# Patient Record
Sex: Female | Born: 1985 | Race: Black or African American | Hispanic: No | Marital: Single | Smoking: Current every day smoker
Health system: Southern US, Community
[De-identification: ages and names within clinical notes are randomized; demographics above are authoritative.]

## PROBLEM LIST (undated history)

## (undated) DIAGNOSIS — Y249XXA Unspecified firearm discharge, undetermined intent, initial encounter: Secondary | ICD-10-CM

## (undated) DIAGNOSIS — D649 Anemia, unspecified: Secondary | ICD-10-CM

## (undated) DIAGNOSIS — W3400XA Accidental discharge from unspecified firearms or gun, initial encounter: Secondary | ICD-10-CM

## (undated) DIAGNOSIS — G4730 Sleep apnea, unspecified: Secondary | ICD-10-CM

## (undated) HISTORY — PX: DENTAL SURGERY: SHX609

---

## 2009-03-23 ENCOUNTER — Emergency Department (HOSPITAL_COMMUNITY): Admission: EM | Admit: 2009-03-23 | Discharge: 2009-03-23 | Payer: Self-pay | Admitting: Emergency Medicine

## 2015-04-14 ENCOUNTER — Emergency Department (HOSPITAL_COMMUNITY)
Admission: EM | Admit: 2015-04-14 | Discharge: 2015-04-14 | Disposition: A | Discharge status: Home / Self Care | Payer: Medicaid Other | Attending: Emergency Medicine | Admitting: Emergency Medicine

## 2015-04-14 ENCOUNTER — Encounter (HOSPITAL_COMMUNITY): Payer: Self-pay | Admitting: Emergency Medicine

## 2015-04-14 DIAGNOSIS — Z720 Tobacco use: Secondary | ICD-10-CM | POA: Insufficient documentation

## 2015-04-14 DIAGNOSIS — K029 Dental caries, unspecified: Secondary | ICD-10-CM | POA: Diagnosis not present

## 2015-04-14 DIAGNOSIS — K002 Abnormalities of size and form of teeth: Secondary | ICD-10-CM | POA: Insufficient documentation

## 2015-04-14 DIAGNOSIS — K0381 Cracked tooth: Secondary | ICD-10-CM | POA: Diagnosis not present

## 2015-04-14 DIAGNOSIS — K088 Other specified disorders of teeth and supporting structures: Secondary | ICD-10-CM | POA: Diagnosis present

## 2015-04-14 MED ORDER — PENICILLIN V POTASSIUM 500 MG PO TABS
500.0000 mg | ORAL_TABLET | Freq: Once | ORAL | Status: AC
  Administered 2015-04-14: 500 mg via ORAL
  Filled 2015-04-14: qty 1

## 2015-04-14 MED ORDER — PENICILLIN V POTASSIUM 500 MG PO TABS: 500.0000 mg | ORAL_TABLET | Freq: Four times a day (QID) | ORAL | Status: AC

## 2015-04-14 MED ORDER — TRAMADOL HCL 50 MG PO TABS: 50.0000 mg | ORAL_TABLET | Freq: Four times a day (QID) | ORAL | Status: DC | PRN

## 2015-04-14 NOTE — ED Notes (Signed)
Pt A+Ox4, reports R lower dental pain "for a while now", worsening, "taking BCs and I think it's worse".  Denies fevers/chills.  Mild swelling noted to cheek.  Speaking full/clear sentences, rr even/un-lab.  No difficulty clearing secretions or maintaining airway.  Skin PWD.  Ambulatory with steady gait.  NAD.

## 2015-04-14 NOTE — Discharge Instructions (Signed)
Dental Caries Dental caries is tooth decay. This decay can cause a hole in teeth (cavity) that can get bigger and deeper over time. HOME CARE  Brush and floss your teeth. Do this at least two times a day.  Use a fluoride toothpaste.  Use a mouth rinse if told by your dentist or doctor.  Eat less sugary and starchy foods. Drink less sugary drinks.  Avoid snacking often on sugary and starchy foods. Avoid sipping often on sugary drinks.  Keep regular checkups and cleanings with your dentist.  Use fluoride supplements if told by your dentist or doctor.  Allow fluoride to be applied to teeth if told by your dentist or doctor. Document Released: 05/10/2008 Document Revised: 12/16/2013 Document Reviewed: 08/03/2012 [facility] Patient Information 2015 [place], [place]. This information is not intended to replace advice given to you by your health care provider. Make sure you discuss any questions you have with your health care provider.  Dental Pain A tooth ache may be caused by cavities (tooth decay). Cavities expose the nerve of the tooth to air and hot or cold temperatures. It may come from an infection or abscess (also called a boil or furuncle) around your tooth. It is also often caused by dental caries (tooth decay). This causes the pain you are having. DIAGNOSIS  Your caregiver can diagnose this problem by exam. TREATMENT   If caused by an infection, it may be treated with medications which kill germs (antibiotics) and pain medications as prescribed by your caregiver. Take medications as directed.  Only take over-the-counter or prescription medicines for pain, discomfort, or fever as directed by your caregiver.  Whether the tooth ache today is caused by infection or dental disease, you should see your dentist as soon as possible for further care. SEEK MEDICAL CARE IF: The exam and treatment you received today has been provided on an emergency basis only. This is not a substitute for  complete medical or dental care. If your problem worsens or new problems (symptoms) appear, and you are unable to meet with your dentist, call or return to this location. SEEK IMMEDIATE MEDICAL CARE IF:   You have a fever.  You develop redness and swelling of your face, jaw, or neck.  You are unable to open your mouth.  You have severe pain uncontrolled by pain medicine. MAKE SURE YOU:   Understand these instructions.  Will watch your condition.  Will get help right away if you are not doing well or get worse. Document Released: 08/01/2005 Document Revised: 10/24/2011 Document Reviewed: 03/19/2008 [facility] Patient Information 2015 [place], [place]. This information is not intended to replace advice given to you by your health care provider. Make sure you discuss any questions you have with your health care provider.   Emergency Department Resource Guide 1) Find a Doctor and Pay Out of Pocket Although you won't have to find out who is covered by your insurance plan, it is a good idea to ask around and get recommendations. You will then need to call the office and see if the doctor you have chosen will accept you as a new patient and what types of options they offer for patients who are self-pay. Some doctors offer discounts or will set up payment plans for their patients who do not have insurance, but you will need to ask so you aren't surprised when you get to your appointment.  2) Contact Your Local Health Department Not all health departments have doctors that can see patients for sick  visits, but many do, so it is worth a call to see if yours does. If you don't know where your local health department is, you can check in your phone book. The CDC also has a tool to help you locate your state's health department, and many state websites also have listings of all of their local health departments.  3) Find a [facility] If your illness is not likely to be very severe or complicated,  you may want to try a walk in clinic. These are popping up all over the country in pharmacies, drugstores, and shopping centers. They're usually staffed by nurse practitioners or physician assistants that have been trained to treat common illnesses and complaints. They're usually fairly quick and inexpensive. However, if you have serious medical issues or chronic medical problems, these are probably not your best option.  No Primary Care Doctor: - Call Health Connect at  [phone] - they can help you locate a primary care doctor that  accepts your insurance, provides certain services, etc. - Physician Referral Service- [phone]  Chronic Pain Problems: Organization         Address  Phone   Notes  [facility]  [phone] Patients need to be referred by their primary care doctor.   Medication Assistance: Organization         Address  Phone   Notes  [facility] Medication [facility] [address] [place], [place] [address] [phone] --Must be a resident of [facility] -- Must have NO insurance coverage whatsoever (no Medicaid/ Medicare, etc.) -- The pt. MUST have a primary care doctor that directs their care regularly and follows them in the community   MedAssist  [phone]   [employer]  [phone]    Agencies that provide inexpensive medical care: Organization         Address  Phone   Notes  [place]  [phone]   [facility]    [phone]   [facility] [place], [place] [address] [phone]   [place] [address] [place] [phone]   Planned Parenthood    [phone]   [facility]    [phone]   [place] and [place] [address], [place] Phone:  [phone], Fax:  [phone] Hours of Operation:  9 am - 6 pm, M-F.  Also accepts Medicaid/Medicare and  self-pay.  [facility] for [place] [address], [address], [place] Phone: [phone], Fax: [phone]. Hours of Operation:  8:30 am - 5:30 pm, M-F.  Also accepts Medicaid and self-pay.  [facility] [address] [place] Phone: [phone]   [place], [place], [place] [phone], Ext. [phone] Mondays & Thursdays: 7-9 AM.  First 15 patients are seen on a first come, first serve basis.    [place] Providers:  Organization         Address  Phone   Notes  [facility] [address],  [phone] Also accepts self-pay patients.  [place], [place]  [phone]   [place], [address], [place] [phone]   [facility] [address] [place] [phone]  Lucianne Lei [address], [place]   [phone] Only accepts [place] patients after they have their name applied to their card.   Self-Pay (no insurance) in [facility]:  Organization         Address  Phone   Notes  Sickle Cell Patients, [facility] [place] [phone]   [facility] [place] [phone]   [facility] [place]  [place], [place], [place] [phone]   [facility]/Dr. Osei-Bonsu  [address], [place] or [address], [place] [phone] Phone number for both [place] and [place] locations is the same.  Urgent Medical and [facility] [address] [place] [phone]   [facility] [address] [place] or [address] [phone]   [facility] [address], [place] [phone], phone;  [phone], fax Sees patients 1st and 3rd Saturday of every month.  Must not qualify for public or private insurance (i.e. Medicaid, Medicare, [place] Health Choice, Veterans' Benefits)  Household income should be no more than 200% of the poverty level The clinic cannot treat you if you are pregnant or think you are pregnant  Sexually transmitted diseases are not treated at the clinic.    Dental Care: Organization         Address  Phone  Notes  [facility] Department of [facility] [place] [phone] Accepts children up to age 76 who are enrolled in [place] or [place]; pregnant women with a Medicaid card; and children who have applied for Medicaid or [place] Health Choice, but were declined, whose parents can pay a reduced fee at time of service.  [facility] Department of [facility]  [address], [place] [phone] Accepts children up to age 84 who are enrolled in [place] or [place]; pregnant women with a Medicaid card; and children who have applied for Medicaid or [place] Health Choice, but were declined, whose parents can pay a reduced fee at time of service.  [place] Adult Dental Access PROGRAM  [place] [phone] Patients are seen by appointment only. Walk-ins are not accepted. [place] will see patients 62 years of age and older. Monday - Tuesday (8am-5pm) Most Wednesdays (8:30-5pm) $30 per visit, cash only  [facility] Adult Dental Access PROGRAM  [address], [facility] [phone] Patients are seen by appointment only. Walk-ins are not accepted. [place] will see patients 69 years of age and older. One Wednesday Evening (Monthly: Volunteer Based).  $30 per visit, cash only  [place]  [phone] for adults; Children under age 25, call Graduate Pediatric Dentistry at [phone]. Children aged 6-14, please call  [phone] to request a pediatric application.  Dental services are provided in all areas of dental care including fillings, crowns and bridges, complete and partial dentures, implants, gum treatment, root canals, and extractions. Preventive care is also provided. Treatment is provided to both adults and children. Patients are selected via a lottery and there is often a waiting list.   [facility] [address] [place]  [phone] [email or web address], [place], [place] [phone], Ext.  [phone] Second and Fourth Thursday of each month, opens at 6:30 AM; Clinic ends at 9 AM.  Patients are seen on a first-come first-served basis, and a limited number are seen during each clinic.   [facility]  [address] Hillard Danker [place], [place] [phone]   Eligibility Requirements You must have lived in [place], [place], or [place] counties for at least the last three months.   You cannot be eligible for state or federal sponsored [employer], including [employer], [place], or [employer].   You generally cannot be eligible for healthcare insurance through your employer.    How to apply: Eligibility screenings are held every Tuesday and Wednesday afternoon from 1:00 pm until 4:00 pm. You do not need an appointment for the interview!  [facility] [address] [place], [place]   [place]  [place] Department  [place]  [phone]    Behavioral Health Resources in the Community: Intensive Outpatient Programs Organization         Address  Phone  Notes  [facility]. [address] [place], [place] [phone]   [facility] Outpatient [address], [place], [place]   ADS: Alcohol [facility] Drug S[facility] [address] [place], [place]   [place] [address]  [place], [place] or [phone]   Substance Abuse Resources Organization         Address  Phone  Notes  Alcohol and Drug Services  [phone]   [place]  [phone]   [facility] [place]   Chinita Pester  [phone]   Residential & Outpatient Substance Abuse Program  [phone]   Psychological Services Organization         Address  Phone  Notes  [facility]  [place]  [phone]   [place] [address] [place] or [phone]    Mobile Crisis Teams Organization         Address  Phone  Notes  [facility], Mobile Crisis Care Unit  [phone]   [facility]  [address] [place], [place]   [place] [address], [place] [phone]    Self-Help/Support Groups Organization         Address  Phone             Notes  [place]. of [place] - variety of support groups  [place] Call for more information  Narcotics Anonymous (NA), [facility] [address], [employer] [place]  2 meetings at this location   Special educational needs teacher         Address  Phone  Notes  [facility] [place],    [place]  [phone]   [facility]  [address] [place] [address], [place], [place]   [place], [place] [phone] Admissions: 8am-3pm M-F  Incentives Substance [place] [address]    [place], [place] [phone]   [facility] [address] Jadene Pierini [place], [place]   The [place].,  [place], [place] - Intensive Outpatient [address] Kristeen [address], [place], [place]   [facility] ([place]  [facility] [address]  °[place], [place] [phone] or [phone]   °[facility] ([facility]) [address] [place], [place] [phone] Accepts Medicaid  °[facility] [address]  °[place] 1-800-659-3381 Substance Abuse/Addiction Treatment  ° °[place] Behavioral Health Resources °Organization         Address  Phone  Notes  °[facility]  ([phone]) [phone]   °Julie Brannon, PhD [address] [place], [place]   ([phone]) [phone] or (336) 951-0000   ° Behavioral   [address] °[place], [place] ([phone]) [phone]   °[facility] [address], [place], [place] ([phone] Insurance/Medicaid/sponsorship through [facility]  °[facility] [address]                                    [place], [place] [phone] Therapy/tele-psych/case  °[facility] [address]  ° [place], [place] ([phone]) [phone]    °Dr. Arfeen  ([phone]   °[facility] [place]  [employer] [place] [facility] 1) [address],  °2) [address], [place] °3)  [address], [place] ([phone]) [phone] °(336) [phone] ° °(336) [phone]   °[place] Child Abuse Hotline (336) 342-1394 or (336) 342-3537 (After Hours)    ° ° ° °

## 2015-04-14 NOTE — ED Provider Notes (Signed)
CSN: [id number]     Arrival date & time 04/14/15  1657 History  This chart was scribed for non-physician practitioner, Danelle Berry, PA-C, working with Pricilla Loveless, MD by Freida Busman, ED Scribe. This patient was seen in room WTR7/WTR7 and the patient's care was started at 5:47 PM.  Chief Complaint  Patient presents with  . Dental Pain    R lower dental pain   The history is provided by the patient. No language interpreter was used.    HPI Comments:  Loretta Brewer is a 29 y.o. female who presents to the Emergency Department complaining of 8/10 right lower dental pain for ~ 4 days. Pt notes she has a broken tooth in the area that hasn't given her pain until 1 month ago, she notes that episode resolved. She believes this episode of pain was caused by something she ate 2 days ago, which caused pain and swelling to her gums and cheeks.  She felt mild nausea and subjective fever 2 days ago, but when she woke up today the swelling was going down, and she currently denies N, V, fever, chills, sweats, neck pain, trouble swallowing, difficulty breathing. Pt applied BC powder to the site with some relief.  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    History reviewed. No pertinent past medical history. History reviewed. No pertinent past surgical history. No family history on file. Social History  Substance Use Topics  . Smoking status: Current Every Day Smoker  . Smokeless tobacco: None  . Alcohol Use: Yes     Comment: 3-4 drinks/week   OB History    No data available     Review of Systems  Constitutional: Negative for fever and chills.  HENT: Positive for dental problem. Negative for trouble swallowing.   Respiratory: Negative for shortness of breath.    Allergies  Review of patient's allergies indicates no known allergies.  Home Medications   Prior to Admission medications   Not on File   BP 126/73  mmHg  Pulse 105  Temp(Src) 98.9 F (37.2 C) (Oral)  Resp 16  Ht 5\' 7"  (1.702 m)  Wt 135 lb (61.236 kg)  BMI 21.14 kg/m2  SpO2 100% Physical Exam  Constitutional: She is oriented to person, place, and time. Vital signs are normal. She appears well-developed and well-nourished. She is cooperative.  Non-toxic appearance. She does not have a sickly appearance. No distress.  HENT:  Head: Normocephalic and atraumatic.  Right Ear: External ear normal.  Left Ear: External ear normal.  Nose: Nose normal.  Mouth/Throat: Oropharynx is clear and moist. Dental caries present. No oropharyngeal exudate.  #29 grossly decayed down to the top of the gumline #28 partially avulsed  TTP in front of both teeth along the gingival and buccal mucousa without any palpable or obvious abscess Multiple carries  Poor dentition  Eyes: Conjunctivae and EOM are normal. Pupils are equal, round, and reactive to light. Right eye exhibits no discharge. Left eye exhibits no discharge. No scleral icterus.  Neck: Normal range of motion. Neck supple. No JVD present. No tracheal deviation present.  Cardiovascular: Normal rate and regular rhythm.   Pulmonary/Chest: Effort normal and breath sounds normal. No stridor. No respiratory distress.  Musculoskeletal: Normal range of motion. She exhibits no edema.  Lymphadenopathy:    She has no cervical adenopathy.  Neurological: She is alert and oriented to person, place, and time. She exhibits normal muscle  tone. Coordination normal.  Skin: Skin is warm and dry. No rash noted. She is not diaphoretic. No erythema. No pallor.  Psychiatric: She has a normal mood and affect. Her behavior is normal. Judgment and thought content normal.   ED Course  Procedures   DIAGNOSTIC STUDIES:  Oxygen Saturation is 100% on RA, normal by my interpretation.    COORDINATION OF CARE:  5:51 PM Discussed treatment plan with pt at bedside and pt agreed to plan.  Labs Review Labs Reviewed - No  data to display  Imaging Review No results found. I have personally reviewed and evaluated these images and lab results as part of my medical decision-making.   EKG Interpretation None      MDM   Final diagnoses:  None   Patient with toothache.  No gross abscess.  Exam unconcerning for Ludwig's angina or spread of infection.  Will treat with clinda and pain medicine.  Urged patient to follow-up with dentist.  Given resources, CM consulted, pt is not from this area, states she has [employer] from [place].     I personally performed the services described in this documentation, which was scribed in my presence. The recorded information has been reviewed and is accurate.     Danelle Berry, PA-C 04/15/15 1933  Pricilla Loveless, MD 04/18/15 [phone]

## 2015-12-04 ENCOUNTER — Emergency Department (HOSPITAL_COMMUNITY): Payer: Medicaid Other

## 2015-12-04 ENCOUNTER — Emergency Department (HOSPITAL_COMMUNITY)
Admission: EM | Admit: 2015-12-04 | Discharge: 2015-12-05 | Disposition: A | Discharge status: Home / Self Care | Payer: Medicaid Other | Attending: Emergency Medicine | Admitting: Emergency Medicine

## 2015-12-04 DIAGNOSIS — Y92410 Unspecified street and highway as the place of occurrence of the external cause: Secondary | ICD-10-CM | POA: Diagnosis not present

## 2015-12-04 DIAGNOSIS — F1010 Alcohol abuse, uncomplicated: Secondary | ICD-10-CM | POA: Diagnosis not present

## 2015-12-04 DIAGNOSIS — Z041 Encounter for examination and observation following transport accident: Secondary | ICD-10-CM | POA: Insufficient documentation

## 2015-12-04 DIAGNOSIS — Y998 Other external cause status: Secondary | ICD-10-CM | POA: Diagnosis not present

## 2015-12-04 DIAGNOSIS — Y9389 Activity, other specified: Secondary | ICD-10-CM | POA: Insufficient documentation

## 2015-12-04 DIAGNOSIS — R55 Syncope and collapse: Secondary | ICD-10-CM | POA: Insufficient documentation

## 2015-12-04 LAB — CBC WITH DIFFERENTIAL/PLATELET
Basophils Absolute: 0 10*3/uL (ref 0.0–0.1)
Basophils Relative: 1 %
Eosinophils Absolute: 0.2 10*3/uL (ref 0.0–0.7)
Eosinophils Relative: 2 %
HEMATOCRIT: 40.6 % (ref 36.0–46.0)
Hemoglobin: 13.2 g/dL (ref 12.0–15.0)
LYMPHS ABS: 2.4 10*3/uL (ref 0.7–4.0)
LYMPHS PCT: 29 %
MCH: 27.3 pg (ref 26.0–34.0)
MCHC: 32.5 g/dL (ref 30.0–36.0)
MCV: 83.9 fL (ref 78.0–100.0)
MONOS PCT: 6 %
Monocytes Absolute: 0.5 10*3/uL (ref 0.1–1.0)
NEUTROS ABS: 5.3 10*3/uL (ref 1.7–7.7)
NEUTROS PCT: 62 %
Platelets: 205 10*3/uL (ref 150–400)
RBC: 4.84 MIL/uL (ref 3.87–5.11)
RDW: 14 % (ref 11.5–15.5)
WBC: 8.4 10*3/uL (ref 4.0–10.5)

## 2015-12-04 LAB — I-STAT CHEM 8, ED
BUN: 8 mg/dL (ref 6–20)
CHLORIDE: 110 mmol/L (ref 101–111)
CREATININE: 1 mg/dL (ref 0.44–1.00)
Calcium, Ion: 1.01 mmol/L — ABNORMAL LOW (ref 1.12–1.23)
GLUCOSE: 91 mg/dL (ref 65–99)
HCT: 45 % (ref 36.0–46.0)
Hemoglobin: 15.3 g/dL — ABNORMAL HIGH (ref 12.0–15.0)
POTASSIUM: 3.5 mmol/L (ref 3.5–5.1)
Sodium: 145 mmol/L (ref 135–145)
TCO2: 19 mmol/L (ref 0–100)

## 2015-12-04 LAB — I-STAT CG4 LACTIC ACID, ED: LACTIC ACID, VENOUS: 3.09 mmol/L — AB (ref 0.5–2.0)

## 2015-12-04 LAB — I-STAT TROPONIN, ED: Troponin i, poc: 0 ng/mL (ref 0.00–0.08)

## 2015-12-04 LAB — PROTIME-INR
INR: 0.97 (ref 0.00–1.49)
Prothrombin Time: 13 seconds (ref 11.6–15.2)

## 2015-12-04 MED ORDER — MIDAZOLAM HCL 2 MG/2ML IJ SOLN
INTRAMUSCULAR | Status: DC
Start: 2015-12-04 — End: 2015-12-05
  Filled 2015-12-04: qty 6

## 2015-12-04 MED ORDER — NALOXONE HCL 0.4 MG/ML IJ SOLN
0.4000 mg | Freq: Once | INTRAMUSCULAR | Status: AC
  Administered 2015-12-05: 0.4 mg via INTRAVENOUS
  Filled 2015-12-04: qty 1

## 2015-12-04 MED ORDER — SODIUM CHLORIDE 0.9 % IV BOLUS (SEPSIS)
1000.0000 mL | Freq: Once | INTRAVENOUS | Status: AC
  Administered 2015-12-04: 1000 mL via INTRAVENOUS

## 2015-12-04 MED ORDER — MIDAZOLAM HCL 2 MG/2ML IJ SOLN: 1.0000 mg | Freq: Once | INTRAMUSCULAR | Status: DC
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
## 2015-12-05 LAB — COMPREHENSIVE METABOLIC PANEL
ALT: 16 U/L (ref 14–54)
ANION GAP: 17 — AB (ref 5–15)
AST: 32 U/L (ref 15–41)
Albumin: 3.9 g/dL (ref 3.5–5.0)
Alkaline Phosphatase: 54 U/L (ref 38–126)
BILIRUBIN TOTAL: 0.5 mg/dL (ref 0.3–1.2)
BUN: 6 mg/dL (ref 6–20)
CALCIUM: 9.1 mg/dL (ref 8.9–10.3)
CO2: 17 mmol/L — ABNORMAL LOW (ref 22–32)
Chloride: 109 mmol/L (ref 101–111)
Creatinine, Ser: 0.8 mg/dL (ref 0.44–1.00)
GFR calc Af Amer: >60 mL/min (ref >60)
Glucose, Bld: 90 mg/dL (ref 65–99)
POTASSIUM: 3.6 mmol/L (ref 3.5–5.1)
Sodium: 143 mmol/L (ref 135–145)
TOTAL PROTEIN: 7 g/dL (ref 6.5–8.1)

## 2015-12-05 LAB — ETHANOL: ALCOHOL ETHYL (B): 282 mg/dL — AB (ref <5)

## 2015-12-05 LAB — LIPASE, BLOOD: LIPASE: 32 U/L (ref 11–51)

## 2015-12-05 MED ORDER — SODIUM CHLORIDE 0.9 % IV BOLUS (SEPSIS)
1000.0000 mL | Freq: Once | INTRAVENOUS | Status: AC
  Administered 2015-12-05: 1000 mL via INTRAVENOUS

## 2015-12-05 NOTE — ED Notes (Addendum)
Attempted 2nd peripheral IV placement to left arm x 3 with no success. Pt appears to have needle track marks on right upper extremity making IV access difficult.   Attempted in/out cath, assisted by Britta MccreedyBarbara, RN, with no success due to difficult anatomy. Dr. Mora Bellmanni at bedside and unable to visualize urethra at this time either. C-collar removed by Dr. Mora Bellmanni. Pt remains responsive to painful stimuli only. When discussing giving pt Narcan, however, pt did shake head exhibiting a "no". Pt also pulled gown down to cover herself. Those are the only purposeful movements noted at present. Continues with snoring respirations.
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
## 2015-12-05 NOTE — ED Notes (Signed)
Pt verbalized understanding of discharge instructions and follow up care

## 2015-12-07 MED ORDER — IOPAMIDOL (ISOVUE-300) INJECTION 61%
100.0000 mL | Freq: Once | INTRAVENOUS | Status: AC | PRN
  Administered 2015-12-04: 100 mL via INTRAVENOUS

## 2015-12-10 ENCOUNTER — Ambulatory Visit (HOSPITAL_COMMUNITY)
Admission: EM | Admit: 2015-12-10 | Discharge: 2015-12-10 | Disposition: A | Discharge status: Home / Self Care | Payer: No Typology Code available for payment source | Attending: Emergency Medicine | Admitting: Emergency Medicine

## 2015-12-10 ENCOUNTER — Encounter (HOSPITAL_COMMUNITY): Payer: Self-pay | Admitting: Emergency Medicine

## 2015-12-10 DIAGNOSIS — M6248 Contracture of muscle, other site: Secondary | ICD-10-CM | POA: Diagnosis not present

## 2015-12-10 DIAGNOSIS — M545 Low back pain, unspecified: Secondary | ICD-10-CM

## 2015-12-10 DIAGNOSIS — M62838 Other muscle spasm: Secondary | ICD-10-CM

## 2015-12-10 DIAGNOSIS — S161XXA Strain of muscle, fascia and tendon at neck level, initial encounter: Secondary | ICD-10-CM

## 2015-12-10 DIAGNOSIS — S161XXD Strain of muscle, fascia and tendon at neck level, subsequent encounter: Secondary | ICD-10-CM

## 2015-12-10 MED ORDER — DICLOFENAC SODIUM 75 MG PO TBEC: 75.0000 mg | DELAYED_RELEASE_TABLET | Freq: Two times a day (BID) | ORAL | Status: DC

## 2015-12-10 MED ORDER — TRAMADOL HCL 50 MG PO TABS: ORAL_TABLET | ORAL | Status: DC

## 2015-12-10 MED ORDER — METAXALONE 800 MG PO TABS: 800.0000 mg | ORAL_TABLET | Freq: Three times a day (TID) | ORAL | Status: DC

## 2015-12-10 NOTE — Discharge Instructions (Signed)
[facility]: [address] [place] [id number]  [phone]  [facility]: [address] [place] [id number]   [phone]  [facility]: [address] [place] [id number]  [phone]  [facility] : [address] [place] [id number] [phone]  [facility]: [address] [place] [phone]  [facility]: [address] [place] [id number] [phone]  Dr. Oneal GroutMahima Pandey [address] [facility] [place] [id number]  [phone]  Dr. Jackie PlumGeorge Osei-Bonsu, [facility]. [address] [place] [address]  [phone]    Take the NSAID on a regular basis for the next 10 days. tramadol for severe pain only. many people find gentle stretching and deep tissue massage helpful. Try [facility] on [address]. They have very reasonable prices and take walk ins. Or you can go to  [facility]. Follow-up with your primary care physician in several days, go to the ER for the signs and symptoms we discussed.
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
## 2015-12-11 ENCOUNTER — Telehealth: Payer: Self-pay | Admitting: Emergency Medicine

## 2016-12-06 ENCOUNTER — Emergency Department (HOSPITAL_COMMUNITY)
Admission: EM | Admit: 2016-12-06 | Discharge: 2016-12-06 | Disposition: A | Discharge status: Home / Self Care | Payer: Medicaid Other | Attending: Emergency Medicine | Admitting: Emergency Medicine

## 2016-12-06 ENCOUNTER — Encounter (HOSPITAL_COMMUNITY): Payer: Self-pay

## 2016-12-06 ENCOUNTER — Emergency Department (HOSPITAL_COMMUNITY): Payer: Medicaid Other

## 2016-12-06 DIAGNOSIS — F17210 Nicotine dependence, cigarettes, uncomplicated: Secondary | ICD-10-CM | POA: Insufficient documentation

## 2016-12-06 DIAGNOSIS — R1031 Right lower quadrant pain: Secondary | ICD-10-CM | POA: Diagnosis not present

## 2016-12-06 DIAGNOSIS — Z79899 Other long term (current) drug therapy: Secondary | ICD-10-CM | POA: Insufficient documentation

## 2016-12-06 DIAGNOSIS — R109 Unspecified abdominal pain: Secondary | ICD-10-CM | POA: Diagnosis present

## 2016-12-06 LAB — URINALYSIS, ROUTINE W REFLEX MICROSCOPIC
Bilirubin Urine: NEGATIVE
Glucose, UA: NEGATIVE mg/dL
Hgb urine dipstick: NEGATIVE
Ketones, ur: NEGATIVE mg/dL
Leukocytes, UA: NEGATIVE
Nitrite: NEGATIVE
Protein, ur: NEGATIVE mg/dL
Specific Gravity, Urine: 1.003 — ABNORMAL LOW (ref 1.005–1.030)
pH: 6 (ref 5.0–8.0)

## 2016-12-06 LAB — CBC WITH DIFFERENTIAL/PLATELET
Basophils Absolute: 0 10*3/uL (ref 0.0–0.1)
Basophils Relative: 0 %
Eosinophils Absolute: 0.5 10*3/uL (ref 0.0–0.7)
Eosinophils Relative: 7 %
HCT: 37.6 % (ref 36.0–46.0)
Hemoglobin: 12.6 g/dL (ref 12.0–15.0)
Lymphocytes Relative: 39 %
Lymphs Abs: 2.6 10*3/uL (ref 0.7–4.0)
MCH: 27.9 pg (ref 26.0–34.0)
MCHC: 33.5 g/dL (ref 30.0–36.0)
MCV: 83.2 fL (ref 78.0–100.0)
Monocytes Absolute: 0.5 10*3/uL (ref 0.1–1.0)
Monocytes Relative: 8 %
Neutro Abs: 3.1 10*3/uL (ref 1.7–7.7)
Neutrophils Relative %: 46 %
Platelets: 252 10*3/uL (ref 150–400)
RBC: 4.52 MIL/uL (ref 3.87–5.11)
RDW: 13.9 % (ref 11.5–15.5)
WBC: 6.7 10*3/uL (ref 4.0–10.5)

## 2016-12-06 LAB — BASIC METABOLIC PANEL
Anion gap: 8 (ref 5–15)
BUN: 8 mg/dL (ref 6–20)
CO2: 24 mmol/L (ref 22–32)
Calcium: 9.3 mg/dL (ref 8.9–10.3)
Chloride: 107 mmol/L (ref 101–111)
Creatinine, Ser: 0.71 mg/dL (ref 0.44–1.00)
GFR calc Af Amer: >60 mL/min (ref >60)
GFR calc non Af Amer: >60 mL/min (ref >60)
Glucose, Bld: 89 mg/dL (ref 65–99)
Potassium: 4.4 mmol/L (ref 3.5–5.1)
Sodium: 139 mmol/L (ref 135–145)

## 2016-12-06 LAB — PREGNANCY, URINE: Preg Test, Ur: NEGATIVE

## 2016-12-06 MED ORDER — IOPAMIDOL (ISOVUE-300) INJECTION 61%
INTRAVENOUS | Status: AC
  Filled 2016-12-06: qty 100

## 2016-12-06 MED ORDER — IOPAMIDOL (ISOVUE-300) INJECTION 61%
INTRAVENOUS | Status: AC
  Filled 2016-12-06: qty 30

## 2016-12-06 MED ORDER — IOPAMIDOL (ISOVUE-300) INJECTION 61%
30.0000 mL | Freq: Once | INTRAVENOUS | Status: DC | PRN
  Administered 2016-12-06: 30 mL via ORAL
  Filled 2016-12-06: qty 30

## 2016-12-06 MED ORDER — IOPAMIDOL (ISOVUE-300) INJECTION 61%
100.0000 mL | Freq: Once | INTRAVENOUS | Status: AC | PRN
  Administered 2016-12-06: 100 mL via INTRAVENOUS

## 2016-12-06 NOTE — ED Triage Notes (Signed)
Patient reports that she woke this AM with abdominal pain that is worse when she bends over. Patient states pain >right.lower. Patient denies N/V/D.

## 2016-12-06 NOTE — ED Provider Notes (Signed)
[facility]-EMERGENCY DEPT Provider Note   CSN: [id number] Arrival date & time: 12/06/16  0741     History   Chief Complaint Chief Complaint  Patient presents with  . Abdominal Pain    HPI Loretta Brewer is a 31 y.o. female.  HPI   30yf with abdominal pain. Onset this morning. Noticed when she woke up. Worse with movement, particularly bending at waist. No n/v. No urinary complaints. No unsuual vaginal bleeding or discharge.   History reviewed. No pertinent past medical history.  There are no active problems to display for this patient.   History reviewed. No pertinent surgical history.  OB History    No data available       Home Medications    Prior to Admission medications   Medication Sig Start Date End Date Taking? Authorizing Provider  diclofenac (VOLTAREN) 75 MG EC tablet Take 1 tablet (75 mg total) by mouth 2 (two) times daily. Take with food 12/10/15   Domenick Gong, MD  Menthol, Topical Analgesic, (ICY HOT EX) Apply topically.    Historical Provider, MD  metaxalone (SKELAXIN) 800 MG tablet Take 1 tablet (800 mg total) by mouth 3 (three) times daily. 12/10/15   Domenick Gong, MD  traMADol Janean Sark) 50 MG tablet 1-2 tabs po q 6 hr prn pain Maximum dose= 8 tablets per day 12/10/15   Domenick Gong, MD    Family History No family history on file.  Social History Social History  Substance Use Topics  . Smoking status: Current Every Day Smoker    Packs/day: 0.25    Types: Cigarettes  . Smokeless tobacco: Never Used  . Alcohol use Yes     Comment: 3-4 drinks/week     Allergies   Patient has no known allergies.   Review of Systems Review of Systems   Physical Exam Updated Vital Signs BP (!) 109/54   Pulse 70   Temp 97.9 F (36.6 C) (Oral)   Resp 16   Ht  (1.702 m)   Wt 135 lb (61.2 kg)   LMP 11/26/2016 (Approximate)   SpO2 100%   BMI 21.14 kg/m   Physical Exam  Constitutional: She appears well-developed and well-nourished. No  distress.  HENT:  Head: Normocephalic and atraumatic.  Eyes: Conjunctivae are normal. Right eye exhibits no discharge. Left eye exhibits no discharge.  Neck: Neck supple.  Cardiovascular: Normal rate, regular rhythm and normal heart sounds.  Exam reveals no gallop and no friction rub.   No murmur heard. Pulmonary/Chest: Effort normal and breath sounds normal. No respiratory distress.  Abdominal: Soft. She exhibits no distension. There is tenderness.    Thin individual. TTP RLQ just lateral to rectus musculature. Question Spigelian hernia? No mass. No rebound tenderness.   Musculoskeletal: She exhibits no edema or tenderness.  Neurological: She is alert.  Skin: Skin is warm and dry.  Psychiatric: She has a normal mood and affect. Her behavior is normal. Thought content normal.  Nursing note and vitals reviewed.    ED Treatments / Results  Labs (all labs ordered are listed, but only abnormal results are displayed) Labs Reviewed  URINALYSIS, ROUTINE W REFLEX MICROSCOPIC - Abnormal; Notable for the following:       Result Value   Color, Urine STRAW (*)    Specific Gravity, Urine 1.003 (*)    All other components within normal limits  CBC WITH DIFFERENTIAL/PLATELET  BASIC METABOLIC PANEL  PREGNANCY, URINE    EKG  EKG Interpretation None  Radiology No results found.   Ct Abdomen Pelvis W Contrast  Result Date: 12/06/2016 CLINICAL DATA:  Right lower quadrant pain EXAM: CT ABDOMEN AND PELVIS WITH CONTRAST TECHNIQUE: Multidetector CT imaging of the abdomen and pelvis was performed using the standard protocol following bolus administration of intravenous contrast. CONTRAST:  ISOVUE-300 IOPAMIDOL (ISOVUE-300) INJECTION 61% COMPARISON:  12/04/2015 FINDINGS: Lower chest: Lung bases are clear. No effusions. Heart is normal size. Hepatobiliary: No focal hepatic abnormality. Gallbladder unremarkable. Pancreas: No focal abnormality or ductal dilatation. Spleen: No focal  abnormality.  Normal size. Adrenals/Urinary Tract: No adrenal abnormality. No focal renal abnormality. No stones or hydronephrosis. Urinary bladder is unremarkable. Stomach/Bowel: Moderate stool burden throughout the colon. I believe the appendix passes medially from the cecum and appears normal. No inflammatory process in the right lower quadrant. Stomach and small bowel decompressed, unremarkable. Vascular/Lymphatic: No evidence of aneurysm or adenopathy. Reproductive: Uterus and adnexa unremarkable.  No mass. Other: No free fluid or free air. Musculoskeletal: No acute bony abnormality. IMPRESSION: No evidence of appendicitis. Moderate stool burden in the colon. No acute findings in the abdomen or pelvis. Electronically Signed   By: Charlett Nose M.D.   On: 12/06/2016 10:31    Procedures Procedures (including critical care time)  Medications Ordered in ED Medications - No data to display   Initial Impression / Assessment and Plan / ED Course  I have reviewed the triage vital signs and the nursing notes.  Pertinent labs & imaging results that were available during my care of the patient were reviewed by me and considered in my medical decision making (see chart for details).     30yf with abdominal pain. CT negative. HD stable. Afebrile. Not pregnant. Doubt emergent condition.   Final Clinical Impressions(s) / ED Diagnoses   Final diagnoses:  Right lower quadrant abdominal pain    New Prescriptions New Prescriptions   No medications on file     Raeford Razor, MD 12/14/16 [phone]

## 2017-08-15 NOTE — L&D Delivery Note (Signed)
   Delivery Note Pt progressed quickly through labor. At 3:13 AM a viable female was delivered via  (Presentation:OA  ).  APGAR: 8/9 ; weight pending. After 1 minute, the cord was clamped and cut. 40 units of pitocin diluted in 1000cc LR was infused rapidly IV.  The placenta separated spontaneously and delivered via CCT and maternal pushing effort.  It was inspected and appears to be intact with a 3 After 1 minute, the cord was clamped and cut. 40 units of pitocin diluted in 1000cc LR was infused rapidly IV.  The placenta separated spontaneously and delivered via CCT and maternal pushing effort.  It was inspected and appears to be intact with a 3 VC  Anesthesia:epidural   Episiotomy:   Lacerations:  none Suture Repair:  Est. Blood Loss (mL):  250  Mom to postpartum.  Baby to Couplet care / Skin to Skin.   The above was performed under my direct supervision and guidance By Dr. Leward QuanSamantha Bearsd.   Loretta CalicoFrances Brewer 06/29/2018, 3:16 AM  .

## 2017-11-17 ENCOUNTER — Other Ambulatory Visit: Payer: Self-pay

## 2017-11-17 ENCOUNTER — Encounter (HOSPITAL_COMMUNITY): Payer: Self-pay

## 2017-11-17 DIAGNOSIS — Y939 Activity, unspecified: Secondary | ICD-10-CM | POA: Insufficient documentation

## 2017-11-17 DIAGNOSIS — Y998 Other external cause status: Secondary | ICD-10-CM | POA: Diagnosis not present

## 2017-11-17 DIAGNOSIS — S199XXA Unspecified injury of neck, initial encounter: Secondary | ICD-10-CM | POA: Diagnosis present

## 2017-11-17 DIAGNOSIS — F17210 Nicotine dependence, cigarettes, uncomplicated: Secondary | ICD-10-CM | POA: Insufficient documentation

## 2017-11-17 DIAGNOSIS — S1081XA Abrasion of other specified part of neck, initial encounter: Secondary | ICD-10-CM | POA: Diagnosis not present

## 2017-11-17 DIAGNOSIS — Y929 Unspecified place or not applicable: Secondary | ICD-10-CM | POA: Insufficient documentation
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
## 2017-11-18 ENCOUNTER — Emergency Department (HOSPITAL_COMMUNITY)
Admission: EM | Admit: 2017-11-18 | Discharge: 2017-11-18 | Disposition: A | Discharge status: Home / Self Care | Payer: Medicaid Other | Source: Home / Self Care | Attending: Emergency Medicine | Admitting: Emergency Medicine

## 2017-11-18 ENCOUNTER — Encounter (HOSPITAL_COMMUNITY): Payer: Self-pay

## 2017-11-18 ENCOUNTER — Emergency Department (HOSPITAL_COMMUNITY)
Admission: EM | Admit: 2017-11-18 | Discharge: 2017-11-18 | Disposition: A | Discharge status: Home / Self Care | Payer: Medicaid Other | Attending: Emergency Medicine | Admitting: Emergency Medicine

## 2017-11-18 ENCOUNTER — Emergency Department (HOSPITAL_COMMUNITY): Payer: Medicaid Other

## 2017-11-18 ENCOUNTER — Other Ambulatory Visit: Payer: Self-pay

## 2017-11-18 DIAGNOSIS — S1091XD Abrasion of unspecified part of neck, subsequent encounter: Secondary | ICD-10-CM

## 2017-11-18 DIAGNOSIS — Z789 Other specified health status: Secondary | ICD-10-CM

## 2017-11-18 DIAGNOSIS — S1091XA Abrasion of unspecified part of neck, initial encounter: Secondary | ICD-10-CM

## 2017-11-18 DIAGNOSIS — Z3490 Encounter for supervision of normal pregnancy, unspecified, unspecified trimester: Secondary | ICD-10-CM

## 2017-11-18 DIAGNOSIS — Z7289 Other problems related to lifestyle: Secondary | ICD-10-CM

## 2017-11-18 LAB — BASIC METABOLIC PANEL
ANION GAP: 9 (ref 5–15)
BUN: 12 mg/dL (ref 6–20)
CO2: 24 mmol/L (ref 22–32)
Calcium: 9 mg/dL (ref 8.9–10.3)
Chloride: 104 mmol/L (ref 101–111)
Creatinine, Ser: 0.65 mg/dL (ref 0.44–1.00)
Glucose, Bld: 103 mg/dL — ABNORMAL HIGH (ref 65–99)
POTASSIUM: 3.6 mmol/L (ref 3.5–5.1)
SODIUM: 137 mmol/L (ref 135–145)

## 2017-11-18 LAB — CBC WITH DIFFERENTIAL/PLATELET
BASOS ABS: 0 10*3/uL (ref 0.0–0.1)
Basophils Relative: 0 %
EOS PCT: 4 %
Eosinophils Absolute: 0.4 10*3/uL (ref 0.0–0.7)
HCT: 35.4 % — ABNORMAL LOW (ref 36.0–46.0)
Hemoglobin: 11.8 g/dL — ABNORMAL LOW (ref 12.0–15.0)
LYMPHS PCT: 29 %
Lymphs Abs: 2.7 10*3/uL (ref 0.7–4.0)
MCH: 27.8 pg (ref 26.0–34.0)
MCHC: 33.3 g/dL (ref 30.0–36.0)
MCV: 83.5 fL (ref 78.0–100.0)
Monocytes Absolute: 1.2 10*3/uL — ABNORMAL HIGH (ref 0.1–1.0)
Monocytes Relative: 12 %
NEUTROS ABS: 5.1 10*3/uL (ref 1.7–7.7)
Neutrophils Relative %: 55 %
PLATELETS: 246 10*3/uL (ref 150–400)
RBC: 4.24 MIL/uL (ref 3.87–5.11)
RDW: 14.2 % (ref 11.5–15.5)
WBC: 9.4 10*3/uL (ref 4.0–10.5)

## 2017-11-18 LAB — I-STAT BETA HCG BLOOD, ED (MC, WL, AP ONLY): I-stat hCG, quantitative: >2000 m[IU]/mL — ABNORMAL HIGH (ref <5)

## 2017-11-18 LAB — HCG, QUANTITATIVE, PREGNANCY: HCG, BETA CHAIN, QUANT, S: 147970 m[IU]/mL — AB (ref <5)

## 2017-11-18 MED ORDER — PRENATAL COMPLETE 14-0.4 MG PO TABS: ORAL_TABLET | ORAL | 0 refills | Status: DC
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
## 2017-11-18 NOTE — ED Notes (Signed)
ED PA at bedside with patient. 

## 2017-11-18 NOTE — Discharge Instructions (Addendum)
You may alternate Tylenol 1000 mg every 6 hours as needed for pain and Ibuprofen 800 mg every 8 hours as needed for pain.  Please take Ibuprofen with food. ° °
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
## 2017-11-18 NOTE — Discharge Instructions (Addendum)
Your seen here today for reported seizures and episode of passing out.  This is likely related to alcohol use.  You are found to have positive pregnancy test.  Please take prenatal vitamins as prescribed.  Please avoid alcohol and ibuprofen.  Please follow-up with women's this week.  You will need additional blood work in 48 hours including quantitative hCG.  Please return if you have any further episodes of passing out, abdominal pain or vaginal bleeding. If you develop worsening or new concerning symptoms you can return to the emergency department for re-evaluation.
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
## 2018-05-07 ENCOUNTER — Ambulatory Visit (INDEPENDENT_AMBULATORY_CARE_PROVIDER_SITE_OTHER): Payer: Self-pay | Admitting: Obstetrics

## 2018-05-07 ENCOUNTER — Other Ambulatory Visit (HOSPITAL_COMMUNITY)
Admission: RE | Admit: 2018-05-07 | Discharge: 2018-05-07 | Disposition: A | Discharge status: Home / Self Care | Payer: Medicaid Other | Source: Ambulatory Visit | Attending: Obstetrics | Admitting: Obstetrics

## 2018-05-07 ENCOUNTER — Encounter: Payer: Self-pay | Admitting: Obstetrics

## 2018-05-07 VITALS — BP 118/78 | HR 110 | Wt 145.9 lb

## 2018-05-07 DIAGNOSIS — F17210 Nicotine dependence, cigarettes, uncomplicated: Secondary | ICD-10-CM

## 2018-05-07 DIAGNOSIS — Z3482 Encounter for supervision of other normal pregnancy, second trimester: Secondary | ICD-10-CM

## 2018-05-07 DIAGNOSIS — Z3490 Encounter for supervision of normal pregnancy, unspecified, unspecified trimester: Secondary | ICD-10-CM | POA: Insufficient documentation

## 2018-05-07 DIAGNOSIS — O0930 Supervision of pregnancy with insufficient antenatal care, unspecified trimester: Secondary | ICD-10-CM

## 2018-05-07 DIAGNOSIS — N898 Other specified noninflammatory disorders of vagina: Secondary | ICD-10-CM

## 2018-05-07 DIAGNOSIS — Z3492 Encounter for supervision of normal pregnancy, unspecified, second trimester: Secondary | ICD-10-CM | POA: Diagnosis not present

## 2018-05-07 DIAGNOSIS — O0932 Supervision of pregnancy with insufficient antenatal care, second trimester: Secondary | ICD-10-CM

## 2018-05-07 DIAGNOSIS — Z3A00 Weeks of gestation of pregnancy not specified: Secondary | ICD-10-CM | POA: Diagnosis not present

## 2018-05-07 MED ORDER — PRENATAL PLUS 27-1 MG PO TABS: 1.0000 | ORAL_TABLET | Freq: Every day | ORAL | 3 refills | Status: DC

## 2018-05-07 NOTE — Progress Notes (Signed)
Pt states this was not a planned pregnancy FOB is active.

## 2018-05-07 NOTE — Progress Notes (Signed)
Subjective:    Loretta Brewer is being seen today for her first obstetrical visit.  This is not a planned pregnancy. She is at [redacted]w[redacted]d gestation. Her obstetrical history is significant for smoker. Relationship with FOB: significant other, living together. Patient does intend to breast feed. Pregnancy history fully reviewed.  The information documented in the HPI was reviewed and verified.  Menstrual History: OB History    Gravida  5   Para      Term      Preterm      AB  2   Living  2     SAB      TAB  2   Ectopic      Multiple      Live Births               Patient's last menstrual period was 11/04/2017.    History reviewed. No pertinent past medical history.  Past Surgical History:  Procedure Laterality Date  . DENTAL SURGERY       (Not in a hospital admission) No Known Allergies  Social History   Tobacco Use  . Smoking status: Current Every Day Smoker    Packs/day: 0.25    Types: Cigarettes  . Smokeless tobacco: Never Used  Substance Use Topics  . Alcohol use: Yes    Comment: early in pregnacy not anymore "been a couple months"    Family History  Problem Relation Age of Onset  . Hypertension Father      Review of Systems Constitutional: negative for weight loss Gastrointestinal: negative for vomiting Genitourinary:negative for genital lesions and vaginal discharge and dysuria Musculoskeletal:negative for back pain Behavioral/Psych: negative for abusive relationship, depression, illegal drug usage and tobacco use    Objective:    BP 118/78   Pulse (!) 110   Wt 145 lb 14.4 oz (66.2 kg)   LMP 11/04/2017   BMI 22.85 kg/m  General Appearance:    Alert, cooperative, no distress, appears stated age  Head:    Normocephalic, without obvious abnormality, atraumatic  Eyes:    PERRL, conjunctiva/corneas clear, EOM's intact, fundi    benign, both eyes  Ears:    Normal TM's and external ear canals, both ears  Nose:   Nares normal, septum midline,  mucosa normal, no drainage    or sinus tenderness  Throat:   Lips, mucosa, and tongue normal; teeth and gums normal  Neck:   Supple, symmetrical, trachea midline, no adenopathy;    thyroid:  no enlargement/tenderness/nodules; no carotid   bruit or JVD  Back:     Symmetric, no curvature, ROM normal, no CVA tenderness  Lungs:     Clear to auscultation bilaterally, respirations unlabored  Chest Wall:    No tenderness or deformity   Heart:    Regular rate and rhythm, S1 and S2 normal, no murmur, rub   or gallop  Breast Exam:    No tenderness, masses, or nipple abnormality  Abdomen:     Soft, non-tender, bowel sounds active all four quadrants,    no masses, no organomegaly  Genitalia:    Normal female without lesion, discharge or tenderness  Extremities:   Extremities normal, atraumatic, no cyanosis or edema  Pulses:   2+ and symmetric all extremities  Skin:   Skin color, texture, turgor normal, no rashes or lesions  Lymph nodes:   Cervical, supraclavicular, and axillary nodes normal  Neurologic:   CNII-XII intact, normal strength, sensation and reflexes    throughout  Lab Review Urine pregnancy test Labs reviewed yes Radiologic studies reviewed no   Assessment:     1. Encounter for supervision of normal pregnancy, antepartum, unspecified gravidity Rx: - Obstetric Panel, Including HIV - Culture, OB Urine - Genetic Screening - SMN1 COPY NUMBER ANALYSIS (SMA Carrier Screen) - Cystic Fibrosis Mutation 97 - Hemoglobinopathy evaluation - Cytology - PAP - US MFM OB COMP + 14 WK; Future  2. Late prenatal care, antepartum  3. Vaginal discharge Rx: - Cervicovaginal ancillary only  4. Tobacco dependence due to cigarettes    Plan:      Prenatal vitamins.  Counseling provided regarding continued use of seat belts, cessation of alcohol consumption, smoking or use of illicit drugs; infection precautions i.e., influenza/TDAP immunizations, toxoplasmosis,CMV, parvovirus, listeria  and varicella; workplace safety, exercise during pregnancy; routine dental care, safe medications, sexual activity, hot tubs, saunas, pools, travel, caffeine use, fish and methlymercury, potential toxins, hair treatments, varicose veins Weight gain recommendations per IOM guidelines reviewed: underweight/BMI< 18.5--> gain 28 - 40 lbs; normal weight/BMI 18.5 - 24.9--> gain 25 - 35 lbs; overweight/BMI 25 - 29.9--> gain 15 - 25 lbs; obese/BMI >30->gain  11 - 20 lbs Problem list reviewed and updated. FIRST/CF mutation testing/NIPT/QUAD SCREEN/fragile X/[name] population testing/Spinal muscular atrophy discussed: requested. Role of ultrasound in pregnancy discussed; fetal survey: requested. Amniocentesis discussed: not indicated.  No orders of the defined types were placed in this encounter.  Orders Placed This Encounter  Procedures  . Culture, OB Urine  . US MFM OB COMP + 14 WK    Standing Status:   Future    Standing Expiration Date:   07/08/2019    Order Specific Question:   Reason for Exam (SYMPTOM  OR DIAGNOSIS REQUIRED)    Answer:   Anatomy    Order Specific Question:   Preferred Imaging Location?    Answer:   [facility]-Ultrasound  . Obstetric Panel, Including HIV  . Genetic Screening    [facility]  . SMN1 COPY NUMBER ANALYSIS (SMA Carrier Screen)  . Cystic Fibrosis Mutation 97  . Hemoglobinopathy evaluation    Follow up in 2 weeks. 50% of 20 min visit spent on counseling and coordination of care.     Brock BadHARLES A. Ulysses Alper MD 05-07-2018

## 2018-05-08 LAB — CERVICOVAGINAL ANCILLARY ONLY
BACTERIAL VAGINITIS: POSITIVE — AB
CANDIDA VAGINITIS: NEGATIVE
CHLAMYDIA, DNA PROBE: NEGATIVE
NEISSERIA GONORRHEA: NEGATIVE
TRICH (WINDOWPATH): POSITIVE — AB

## 2018-05-09 ENCOUNTER — Other Ambulatory Visit: Payer: Self-pay | Admitting: Obstetrics

## 2018-05-09 DIAGNOSIS — A5901 Trichomonal vulvovaginitis: Secondary | ICD-10-CM

## 2018-05-09 DIAGNOSIS — N760 Acute vaginitis: Secondary | ICD-10-CM

## 2018-05-09 DIAGNOSIS — B9689 Other specified bacterial agents as the cause of diseases classified elsewhere: Secondary | ICD-10-CM

## 2018-05-09 LAB — CYTOLOGY - PAP
Diagnosis: NEGATIVE
HPV (WINDOPATH): NOT DETECTED

## 2018-05-09 MED ORDER — TINIDAZOLE 500 MG PO TABS: 2.0000 g | ORAL_TABLET | Freq: Every day | ORAL | 0 refills | Status: DC

## 2018-05-10 ENCOUNTER — Other Ambulatory Visit: Payer: Self-pay | Admitting: Obstetrics

## 2018-05-10 ENCOUNTER — Encounter (HOSPITAL_COMMUNITY): Payer: Self-pay

## 2018-05-10 DIAGNOSIS — O99019 Anemia complicating pregnancy, unspecified trimester: Secondary | ICD-10-CM

## 2018-05-10 LAB — OBSTETRIC PANEL, INCLUDING HIV
Antibody Screen: NEGATIVE
BASOS: 0 %
Basophils Absolute: 0 10*3/uL (ref 0.0–0.2)
EOS (ABSOLUTE): 0.3 10*3/uL (ref 0.0–0.4)
Eos: 4 %
HEMATOCRIT: 28.7 % — AB (ref 34.0–46.6)
HIV Screen 4th Generation wRfx: NONREACTIVE
Hemoglobin: 9.5 g/dL — ABNORMAL LOW (ref 11.1–15.9)
Hepatitis B Surface Ag: NEGATIVE
IMMATURE GRANS (ABS): 0 10*3/uL (ref 0.0–0.1)
Immature Granulocytes: 1 %
Lymphocytes Absolute: 1.5 10*3/uL (ref 0.7–3.1)
Lymphs: 20 %
MCH: 27.3 pg (ref 26.6–33.0)
MCHC: 33.1 g/dL (ref 31.5–35.7)
MCV: 83 fL (ref 79–97)
MONOCYTES: 6 %
Monocytes Absolute: 0.5 10*3/uL (ref 0.1–0.9)
NEUTROS ABS: 5.1 10*3/uL (ref 1.4–7.0)
Neutrophils: 69 %
Platelets: 185 10*3/uL (ref 150–450)
RBC: 3.48 x10E6/uL — ABNORMAL LOW (ref 3.77–5.28)
RDW: 13 % (ref 12.3–15.4)
RH TYPE: POSITIVE
RPR: NONREACTIVE
RUBELLA: 8.18 {index} (ref >0.99)
WBC: 7.4 10*3/uL (ref 3.4–10.8)

## 2018-05-10 LAB — HEMOGLOBINOPATHY EVALUATION
HEMOGLOBIN A2 QUANTITATION: 2.6 % (ref 1.8–3.2)
HEMOGLOBIN F QUANTITATION: 0.7 % (ref 0.0–2.0)
HGB C: 0 %
HGB S: 0 %
HGB VARIANT: 0 %
Hgb A: 96.7 % (ref 96.4–98.8)

## 2018-05-10 MED ORDER — FERROUS SULFATE 325 (65 FE) MG PO TABS: 325.0000 mg | ORAL_TABLET | Freq: Two times a day (BID) | ORAL | 5 refills | Status: DC

## 2018-05-11 LAB — URINE CULTURE, OB REFLEX

## 2018-05-11 LAB — CULTURE, OB URINE

## 2018-05-13 ENCOUNTER — Other Ambulatory Visit: Payer: Self-pay | Admitting: Obstetrics

## 2018-05-13 DIAGNOSIS — N760 Acute vaginitis: Secondary | ICD-10-CM

## 2018-05-13 DIAGNOSIS — A5901 Trichomonal vulvovaginitis: Secondary | ICD-10-CM

## 2018-05-13 DIAGNOSIS — B9689 Other specified bacterial agents as the cause of diseases classified elsewhere: Secondary | ICD-10-CM

## 2018-05-14 ENCOUNTER — Other Ambulatory Visit: Payer: Self-pay | Admitting: Obstetrics

## 2018-05-14 DIAGNOSIS — N3000 Acute cystitis without hematuria: Secondary | ICD-10-CM

## 2018-05-14 MED ORDER — SULFAMETHOXAZOLE-TRIMETHOPRIM 800-160 MG PO TABS: 1.0000 | ORAL_TABLET | Freq: Two times a day (BID) | ORAL | 0 refills | Status: DC

## 2018-05-15 LAB — SMN1 COPY NUMBER ANALYSIS (SMA CARRIER SCREENING)

## 2018-05-15 LAB — CYSTIC FIBROSIS MUTATION 97: Interpretation: NOT DETECTED

## 2018-05-17 ENCOUNTER — Other Ambulatory Visit (HOSPITAL_COMMUNITY): Payer: Self-pay | Admitting: *Deleted

## 2018-05-17 ENCOUNTER — Ambulatory Visit (HOSPITAL_COMMUNITY)
Admission: RE | Admit: 2018-05-17 | Discharge: 2018-05-17 | Disposition: A | Discharge status: Home / Self Care | Payer: Medicaid Other | Source: Ambulatory Visit | Attending: Obstetrics | Admitting: Obstetrics

## 2018-05-17 DIAGNOSIS — O0933 Supervision of pregnancy with insufficient antenatal care, third trimester: Secondary | ICD-10-CM | POA: Diagnosis not present

## 2018-05-17 DIAGNOSIS — O99333 Smoking (tobacco) complicating pregnancy, third trimester: Secondary | ICD-10-CM

## 2018-05-17 DIAGNOSIS — F17200 Nicotine dependence, unspecified, uncomplicated: Secondary | ICD-10-CM | POA: Insufficient documentation

## 2018-05-17 DIAGNOSIS — Z363 Encounter for antenatal screening for malformations: Secondary | ICD-10-CM

## 2018-05-17 DIAGNOSIS — Z3490 Encounter for supervision of normal pregnancy, unspecified, unspecified trimester: Secondary | ICD-10-CM

## 2018-05-17 DIAGNOSIS — Z3689 Encounter for other specified antenatal screening: Secondary | ICD-10-CM | POA: Diagnosis present

## 2018-05-17 DIAGNOSIS — Z362 Encounter for other antenatal screening follow-up: Secondary | ICD-10-CM

## 2018-05-17 DIAGNOSIS — Z3A32 32 weeks gestation of pregnancy: Secondary | ICD-10-CM | POA: Diagnosis not present

## 2018-05-21 ENCOUNTER — Ambulatory Visit (INDEPENDENT_AMBULATORY_CARE_PROVIDER_SITE_OTHER): Payer: Medicaid Other | Admitting: Obstetrics

## 2018-05-21 ENCOUNTER — Other Ambulatory Visit: Payer: Medicaid Other

## 2018-05-21 ENCOUNTER — Encounter: Payer: Self-pay | Admitting: Obstetrics

## 2018-05-21 DIAGNOSIS — Z3481 Encounter for supervision of other normal pregnancy, first trimester: Secondary | ICD-10-CM

## 2018-05-21 DIAGNOSIS — Z3490 Encounter for supervision of normal pregnancy, unspecified, unspecified trimester: Secondary | ICD-10-CM

## 2018-05-21 NOTE — Progress Notes (Signed)
Patient reports good fetal movement, complains of ligament pain. Pt ate candy this morning, so will reschedule 2hr glucose.

## 2018-05-21 NOTE — Progress Notes (Signed)
Subjective:  Loretta Brewer is a 32 y.o. [id number] at [redacted]w[redacted]d being seen today for ongoing prenatal care.  She is currently monitored for the following issues for this low-risk pregnancy and has Supervision of normal pregnancy on their problem list.  Patient reports no complaints.  Contractions: Not present. Vag. Bleeding: None.  Movement: Present. Denies leaking of fluid.   The following portions of the patient's history were reviewed and updated as appropriate: allergies, current medications, past family history, past medical history, past social history, past surgical history and problem list. Problem list updated.  Objective:   Vitals:   05/21/18 0915  BP: (!) 102/56  Pulse: 98  Weight: 143 lb 12.8 oz (65.2 kg)    Fetal Status:     Movement: Present     General:  Alert, oriented and cooperative. Patient is in no acute distress.  Skin: Skin is warm and dry. No rash noted.   Cardiovascular: Normal heart rate noted  Respiratory: Normal respiratory effort, no problems with respiration noted  Abdomen: Soft, gravid, appropriate for gestational age. Pain/Pressure: Present     Pelvic:  Cervical exam deferred        Extremities: Normal range of motion.  Edema: None  Mental Status: Normal mood and affect. Normal behavior. Normal judgment and thought content.   Urinalysis:      Assessment and Plan:  Pregnancy: [id number] at [redacted]w[redacted]d  1. Encounter for supervision of normal pregnancy, antepartum, unspecified gravidity   Preterm labor symptoms and general obstetric precautions including but not limited to vaginal bleeding, contractions, leaking of fluid and fetal movement were reviewed in detail with the patient. Please refer to After Visit Summary for other counseling recommendations.  Return in about 2 weeks (around 06/04/2018) for ROB.   Brock Bad, MD

## 2018-05-22 ENCOUNTER — Other Ambulatory Visit: Payer: Medicaid Other

## 2018-05-22 DIAGNOSIS — Z3490 Encounter for supervision of normal pregnancy, unspecified, unspecified trimester: Secondary | ICD-10-CM

## 2018-05-23 LAB — HIV ANTIBODY (ROUTINE TESTING W REFLEX): HIV Screen 4th Generation wRfx: NONREACTIVE

## 2018-05-23 LAB — GLUCOSE TOLERANCE, 2 HOURS W/ 1HR
Glucose, 1 hour: 105 mg/dL (ref 65–179)
Glucose, 2 hour: 77 mg/dL (ref 65–152)
Glucose, Fasting: 69 mg/dL (ref 65–91)

## 2018-05-23 LAB — CBC
HEMATOCRIT: 28.3 % — AB (ref 34.0–46.6)
HEMOGLOBIN: 9.9 g/dL — AB (ref 11.1–15.9)
MCH: 27.9 pg (ref 26.6–33.0)
MCHC: 35 g/dL (ref 31.5–35.7)
MCV: 80 fL (ref 79–97)
Platelets: 197 10*3/uL (ref 150–450)
RBC: 3.55 x10E6/uL — AB (ref 3.77–5.28)
RDW: 13.5 % (ref 12.3–15.4)
WBC: 7.9 10*3/uL (ref 3.4–10.8)

## 2018-05-23 LAB — RPR: RPR Ser Ql: NONREACTIVE

## 2018-05-29 ENCOUNTER — Encounter: Payer: Self-pay | Admitting: *Deleted

## 2018-06-06 ENCOUNTER — Ambulatory Visit (INDEPENDENT_AMBULATORY_CARE_PROVIDER_SITE_OTHER): Payer: Medicaid Other | Admitting: Obstetrics

## 2018-06-06 ENCOUNTER — Encounter: Payer: Self-pay | Admitting: Obstetrics

## 2018-06-06 DIAGNOSIS — Z3480 Encounter for supervision of other normal pregnancy, unspecified trimester: Secondary | ICD-10-CM

## 2018-06-06 DIAGNOSIS — Z23 Encounter for immunization: Secondary | ICD-10-CM | POA: Diagnosis not present

## 2018-06-06 DIAGNOSIS — Z3483 Encounter for supervision of other normal pregnancy, third trimester: Secondary | ICD-10-CM

## 2018-06-06 NOTE — Patient Instructions (Signed)
Tdap Vaccine (Tetanus, Diphtheria and Pertussis): What You Need to Know 1. Why get vaccinated? Tetanus, diphtheria and pertussis are very serious diseases. Tdap vaccine can protect us from these diseases. And, Tdap vaccine given to pregnant women can protect newborn babies against pertussis. TETANUS (Lockjaw) is rare in the [place] today. It causes painful muscle tightening and stiffness, usually all over the body.  It can lead to tightening of muscles in the head and neck so you can't open your mouth, swallow, or sometimes even breathe. Tetanus kills about 1 out of 10 people who are infected even after receiving the best medical care.  DIPHTHERIA is also rare in the [place] today. It can cause a thick coating to form in the back of the throat.  It can lead to breathing problems, heart failure, paralysis, and death.  PERTUSSIS (Whooping Cough) causes severe coughing spells, which can cause difficulty breathing, vomiting and disturbed sleep.  It can also lead to weight loss, incontinence, and rib fractures. Up to 2 in 100 adolescents and 5 in 100 adults with pertussis are hospitalized or have complications, which could include pneumonia or death.  These diseases are caused by bacteria. Diphtheria and pertussis are spread from person to person through secretions from coughing or sneezing. Tetanus enters the body through cuts, scratches, or wounds. Before vaccines, as many as 200,000 cases of diphtheria, 200,000 cases of pertussis, and hundreds of cases of tetanus, were reported in the [place] each year. Since vaccination began, reports of cases for tetanus and diphtheria have dropped by about 99% and for pertussis by about 80%. 2. Tdap vaccine Tdap vaccine can protect adolescents and adults from tetanus, diphtheria, and pertussis. One dose of Tdap is routinely given at age 11 or 12. People who did not get Tdap at that age should get it as soon as possible. Tdap is especially  important for healthcare professionals and anyone having close contact with a baby younger than 12 months. Pregnant women should get a dose of Tdap during every pregnancy, to protect the newborn from pertussis. Infants are most at risk for severe, life-threatening complications from pertussis. Another vaccine, called Td, protects against tetanus and diphtheria, but not pertussis. A Td booster should be given every 10 years. Tdap may be given as one of these boosters if you have never gotten Tdap before. Tdap may also be given after a severe cut or burn to prevent tetanus infection. Your doctor or the person giving you the vaccine can give you more information. Tdap may safely be given at the same time as other vaccines. 3. Some people should not get this vaccine  A person who has ever had a life-threatening allergic reaction after a previous dose of any diphtheria, tetanus or pertussis containing vaccine, OR has a severe allergy to any part of this vaccine, should not get Tdap vaccine. Tell the person giving the vaccine about any severe allergies.  Anyone who had coma or long repeated seizures within 7 days after a childhood dose of DTP or DTaP, or a previous dose of Tdap, should not get Tdap, unless a cause other than the vaccine was found. They can still get Td.  Talk to your doctor if you: ? have seizures or another nervous system problem, ? had severe pain or swelling after any vaccine containing diphtheria, tetanus or pertussis, ? ever had a condition called Guillain-Barr Syndrome (GBS), ? aren't feeling well on the day the shot is scheduled. 4. Risks With any medicine, including   vaccines, there is a chance of side effects. These are usually mild and go away on their own. Serious reactions are also possible but are rare. Most people who get Tdap vaccine do not have any problems with it. Mild problems following Tdap: (Did not interfere with activities)  Pain where the shot was given (about  3 in 4 adolescents or 2 in 3 adults)  Redness or swelling where the shot was given (about 1 person in 5)  Mild fever of at least 100.4F (up to about 1 in 25 adolescents or 1 in 100 adults)  Headache (about 3 or 4 people in 10)  Tiredness (about 1 person in 3 or 4)  Nausea, vomiting, diarrhea, stomach ache (up to 1 in 4 adolescents or 1 in 10 adults)  Chills, sore joints (about 1 person in 10)  Body aches (about 1 person in 3 or 4)  Rash, swollen glands (uncommon)  Moderate problems following Tdap: (Interfered with activities, but did not require medical attention)  Pain where the shot was given (up to 1 in 5 or 6)  Redness or swelling where the shot was given (up to about 1 in 16 adolescents or 1 in 12 adults)  Fever over 102F (about 1 in 100 adolescents or 1 in 250 adults)  Headache (about 1 in 7 adolescents or 1 in 10 adults)  Nausea, vomiting, diarrhea, stomach ache (up to 1 or 3 people in 100)  Swelling of the entire arm where the shot was given (up to about 1 in 500).  Severe problems following Tdap: (Unable to perform usual activities; required medical attention)  Swelling, severe pain, bleeding and redness in the arm where the shot was given (rare).  Problems that could happen after any vaccine:  People sometimes faint after a medical procedure, including vaccination. Sitting or lying down for about 15 minutes can help prevent fainting, and injuries caused by a fall. Tell your doctor if you feel dizzy, or have vision changes or ringing in the ears.  Some people get severe pain in the shoulder and have difficulty moving the arm where a shot was given. This happens very rarely.  Any medication can cause a severe allergic reaction. Such reactions from a vaccine are very rare, estimated at fewer than 1 in a million doses, and would happen within a few minutes to a few hours after the vaccination. As with any medicine, there is a very remote chance of a vaccine  causing a serious injury or death. The safety of vaccines is always being monitored. For more information, visit: www.cdc.gov/vaccinesafety/ 5. What if there is a serious problem? What should I look for? Look for anything that concerns you, such as signs of a severe allergic reaction, very high fever, or unusual behavior. Signs of a severe allergic reaction can include hives, swelling of the face and throat, difficulty breathing, a fast heartbeat, dizziness, and weakness. These would usually start a few minutes to a few hours after the vaccination. What should I do?  If you think it is a severe allergic reaction or other emergency that can't wait, call 9-1-1 or get the person to the nearest hospital. Otherwise, call your doctor.  Afterward, the reaction should be reported to the Vaccine Adverse Event Reporting System (VAERS). Your doctor might file this report, or you can do it yourself through the VAERS web site at www.vaers.hhs.gov, or by calling 1-800-822-7967. ? VAERS does not give medical advice. 6. The National Vaccine Injury Compensation Program The National   Vaccine Injury Compensation Program (VICP) is a federal program that was created to compensate people who may have been injured by certain vaccines. Persons who believe they may have been injured by a vaccine can learn about the program and about filing a claim by calling 1-800-338-2382 or visiting the VICP website at www.hrsa.gov/vaccinecompensation. There is a time limit to file a claim for compensation. 7. How can I learn more?  Ask your doctor. He or she can give you the vaccine package insert or suggest other sources of information.  Call your local or state health department.  Contact the Centers for Disease Control and Prevention (CDC): ? Call 1-800-232-4636 (1-800-CDC-INFO) or ? Visit CDC's website at www.cdc.gov/vaccines CDC Tdap Vaccine VIS (10/08/13) This information is not intended to replace advice given to you by your  health care provider. Make sure you discuss any questions you have with your health care provider. Document Released: 01/31/2012 Document Revised: 04/21/2016 Document Reviewed: 04/21/2016 Elsevier Interactive Patient Education  2017 [employer].  

## 2018-06-06 NOTE — Progress Notes (Signed)
Subjective:  Loretta Brewer is a 32 y.o. [id number] at [redacted]w[redacted]d being seen today for ongoing prenatal care.  She is currently monitored for the following issues for this low-risk pregnancy and has Supervision of normal pregnancy on their problem list.  Patient reports no complaints.  Contractions: Irritability. Vag. Bleeding: None.  Movement: Present. Denies leaking of fluid.   The following portions of the patient's history were reviewed and updated as appropriate: allergies, current medications, past family history, past medical history, past social history, past surgical history and problem list. Problem list updated.  Objective:   Vitals:   06/06/18 0907  BP: 99/61  Pulse: 96  Weight: 143 lb 11.2 oz (65.2 kg)    Fetal Status:     Movement: Present     General:  Alert, oriented and cooperative. Patient is in no acute distress.  Skin: Skin is warm and dry. No rash noted.   Cardiovascular: Normal heart rate noted  Respiratory: Normal respiratory effort, no problems with respiration noted  Abdomen: Soft, gravid, appropriate for gestational age. Pain/Pressure: Present     Pelvic:  Cervical exam deferred        Extremities: Normal range of motion.  Edema: None  Mental Status: Normal mood and affect. Normal behavior. Normal judgment and thought content.   Urinalysis:      Assessment and Plan:  Pregnancy: [id number] at [redacted]w[redacted]d  1. Supervision of other normal pregnancy, antepartum Rx: - Tdap vaccine greater than or equal to 7yo IM  Preterm labor symptoms and general obstetric precautions including but not limited to vaginal bleeding, contractions, leaking of fluid and fetal movement were reviewed in detail with the patient. Please refer to After Visit Summary for other counseling recommendations.  Return in about 1 week (around 06/13/2018) for ROB.   Brock Bad, MD

## 2018-06-07 ENCOUNTER — Ambulatory Visit (HOSPITAL_COMMUNITY)
Admission: RE | Admit: 2018-06-07 | Discharge: 2018-06-07 | Disposition: A | Discharge status: Home / Self Care | Payer: Medicaid Other | Source: Ambulatory Visit | Attending: Obstetrics | Admitting: Obstetrics

## 2018-06-07 DIAGNOSIS — O99333 Smoking (tobacco) complicating pregnancy, third trimester: Secondary | ICD-10-CM | POA: Diagnosis not present

## 2018-06-07 DIAGNOSIS — Z362 Encounter for other antenatal screening follow-up: Secondary | ICD-10-CM | POA: Insufficient documentation

## 2018-06-07 DIAGNOSIS — Z3A35 35 weeks gestation of pregnancy: Secondary | ICD-10-CM | POA: Diagnosis not present

## 2018-06-07 DIAGNOSIS — O0933 Supervision of pregnancy with insufficient antenatal care, third trimester: Secondary | ICD-10-CM | POA: Diagnosis not present

## 2018-06-13 ENCOUNTER — Ambulatory Visit (INDEPENDENT_AMBULATORY_CARE_PROVIDER_SITE_OTHER): Payer: Medicaid Other | Admitting: Obstetrics

## 2018-06-13 ENCOUNTER — Encounter: Payer: Self-pay | Admitting: Obstetrics

## 2018-06-13 ENCOUNTER — Other Ambulatory Visit (HOSPITAL_COMMUNITY)
Admission: RE | Admit: 2018-06-13 | Discharge: 2018-06-13 | Disposition: A | Discharge status: Home / Self Care | Payer: Medicaid Other | Source: Ambulatory Visit | Attending: Obstetrics | Admitting: Obstetrics

## 2018-06-13 VITALS — BP 108/68 | HR 95 | Wt 148.9 lb

## 2018-06-13 DIAGNOSIS — Z3483 Encounter for supervision of other normal pregnancy, third trimester: Secondary | ICD-10-CM | POA: Diagnosis not present

## 2018-06-13 DIAGNOSIS — Z3480 Encounter for supervision of other normal pregnancy, unspecified trimester: Secondary | ICD-10-CM | POA: Diagnosis present

## 2018-06-13 NOTE — Progress Notes (Signed)
Patient reports good fetal movement with irregular contractions.  

## 2018-06-13 NOTE — Progress Notes (Signed)
Subjective:  Loretta Brewer is a 32 y.o. [id number] at [redacted]w[redacted]d being seen today for ongoing prenatal care.  She is currently monitored for the following issues for this low-risk pregnancy and has Supervision of normal pregnancy on their problem list.  Patient reports no complaints.  Contractions: Irregular. Vag. Bleeding: None.  Movement: Present. Denies leaking of fluid.   The following portions of the patient's history were reviewed and updated as appropriate: allergies, current medications, past family history, past medical history, past social history, past surgical history and problem list. Problem list updated.  Objective:   Vitals:   06/13/18 0855  BP: 108/68  Pulse: 95  Weight: 148 lb 14.4 oz (67.5 kg)    Fetal Status: Fetal Heart Rate (bpm): 150   Movement: Present     General:  Alert, oriented and cooperative. Patient is in no acute distress.  Skin: Skin is warm and dry. No rash noted.   Cardiovascular: Normal heart rate noted  Respiratory: Normal respiratory effort, no problems with respiration noted  Abdomen: Soft, gravid, appropriate for gestational age. Pain/Pressure: Absent     Pelvic:  Cervical exam deferred        Extremities: Normal range of motion.  Edema: None  Mental Status: Normal mood and affect. Normal behavior. Normal judgment and thought content.   Urinalysis:      Assessment and Plan:  Pregnancy: [id number] at [redacted]w[redacted]d  1. Supervision of other normal pregnancy, antepartum Rx: - Cervicovaginal ancillary only( [place]) - Strep Gp B NAA  Preterm labor symptoms and general obstetric precautions including but not limited to vaginal bleeding, contractions, leaking of fluid and fetal movement were reviewed in detail with the patient. Please refer to After Visit Summary for other counseling recommendations.  Return in about 1 week (around 06/20/2018) for ROB.   Brock Bad, MD

## 2018-06-14 LAB — CERVICOVAGINAL ANCILLARY ONLY
BACTERIAL VAGINITIS: NEGATIVE
Candida vaginitis: NEGATIVE
Chlamydia: NEGATIVE
NEISSERIA GONORRHEA: NEGATIVE
TRICH (WINDOWPATH): NEGATIVE

## 2018-06-15 LAB — STREP GP B NAA: Strep Gp B NAA: NEGATIVE

## 2018-06-22 ENCOUNTER — Ambulatory Visit (INDEPENDENT_AMBULATORY_CARE_PROVIDER_SITE_OTHER): Payer: Medicaid Other | Admitting: Obstetrics

## 2018-06-22 ENCOUNTER — Encounter: Payer: Self-pay | Admitting: Obstetrics

## 2018-06-22 VITALS — BP 103/69 | HR 103 | Wt 148.6 lb

## 2018-06-22 DIAGNOSIS — Z3480 Encounter for supervision of other normal pregnancy, unspecified trimester: Secondary | ICD-10-CM

## 2018-06-22 DIAGNOSIS — Z3483 Encounter for supervision of other normal pregnancy, third trimester: Secondary | ICD-10-CM

## 2018-06-22 NOTE — Progress Notes (Signed)
Pt is here for ROB. G5P2. [redacted]w[redacted]d.

## 2018-06-22 NOTE — Progress Notes (Signed)
Subjective:  Loretta Brewer is a 32 y.o. [id number] at [redacted]w[redacted]d being seen today for ongoing prenatal care.  She is currently monitored for the following issues for this low-risk pregnancy and has Supervision of normal pregnancy on their problem list.  Patient reports heartburn.  Contractions: Irregular. Vag. Bleeding: None.  Movement: Present. Denies leaking of fluid.   The following portions of the patient's history were reviewed and updated as appropriate: allergies, current medications, past family history, past medical history, past social history, past surgical history and problem list. Problem list updated.  Objective:   Vitals:   06/22/18 0809  BP: 103/69  Pulse: (!) 103  Weight: 148 lb 9.6 oz (67.4 kg)    Fetal Status: Fetal Heart Rate (bpm): 150   Movement: Present     General:  Alert, oriented and cooperative. Patient is in no acute distress.  Skin: Skin is warm and dry. No rash noted.   Cardiovascular: Normal heart rate noted  Respiratory: Normal respiratory effort, no problems with respiration noted  Abdomen: Soft, gravid, appropriate for gestational age. Pain/Pressure: Absent     Pelvic:  Cervical exam deferred        Extremities: Normal range of motion.  Edema: None  Mental Status: Normal mood and affect. Normal behavior. Normal judgment and thought content.   Urinalysis:      Assessment and Plan:  Pregnancy: [id number] at [redacted]w[redacted]d  1. Supervision of other normal pregnancy, antepartum   Term labor symptoms and general obstetric precautions including but not limited to vaginal bleeding, contractions, leaking of fluid and fetal movement were reviewed in detail with the patient. Please refer to After Visit Summary for other counseling recommendations.  Return in about 1 week (around 06/29/2018) for ROB.   Brock Bad, MD

## 2018-06-27 ENCOUNTER — Other Ambulatory Visit: Payer: Self-pay

## 2018-06-27 ENCOUNTER — Encounter: Payer: Medicaid Other | Admitting: Obstetrics

## 2018-06-27 ENCOUNTER — Encounter: Payer: Self-pay | Admitting: Obstetrics

## 2018-06-27 ENCOUNTER — Ambulatory Visit (INDEPENDENT_AMBULATORY_CARE_PROVIDER_SITE_OTHER): Payer: Medicaid Other | Admitting: Obstetrics

## 2018-06-27 VITALS — BP 120/69 | HR 81 | Wt 156.5 lb

## 2018-06-27 DIAGNOSIS — Z3490 Encounter for supervision of normal pregnancy, unspecified, unspecified trimester: Secondary | ICD-10-CM

## 2018-06-27 DIAGNOSIS — Z3483 Encounter for supervision of other normal pregnancy, third trimester: Secondary | ICD-10-CM

## 2018-06-27 DIAGNOSIS — Z3A38 38 weeks gestation of pregnancy: Secondary | ICD-10-CM

## 2018-06-27 NOTE — Progress Notes (Signed)
Subjective:  Loretta Brewer is a 32 y.o. [id number] at 8922w0d being seen today for ongoing prenatal care.  She is currently monitored for the following issues for this low-risk pregnancy and has Supervision of normal pregnancy on their problem list.  Patient reports no complaints.  Contractions: Irritability. Vag. Bleeding: None.  Movement: Present. Denies leaking of fluid.   The following portions of the patient's history were reviewed and updated as appropriate: allergies, current medications, past family history, past medical history, past social history, past surgical history and problem list. Problem list updated.  Objective:   Vitals:   06/27/18 1327  BP: 120/69  Pulse: 81  Weight: 156 lb 8 oz (71 kg)    Fetal Status: Fetal Heart Rate (bpm): 150   Movement: Present     General:  Alert, oriented and cooperative. Patient is in no acute distress.  Skin: Skin is warm and dry. No rash noted.   Cardiovascular: Normal heart rate noted  Respiratory: Normal respiratory effort, no problems with respiration noted  Abdomen: Soft, gravid, appropriate for gestational age. Pain/Pressure: Present     Pelvic:  Cervical exam deferred        Extremities: Normal range of motion.  Edema: None  Mental Status: Normal mood and affect. Normal behavior. Normal judgment and thought content.   Urinalysis:      Assessment and Plan:  Pregnancy: [id number] at 5922w0d  1. Encounter for supervision of normal pregnancy, antepartum, unspecified gravidity   Term labor symptoms and general obstetric precautions including but not limited to vaginal bleeding, contractions, leaking of fluid and fetal movement were reviewed in detail with the patient. Please refer to After Visit Summary for other counseling recommendations.  Return in about 1 week (around 07/04/2018) for ROB.   Brock BadHarper, Charles A, MD

## 2018-06-29 ENCOUNTER — Inpatient Hospital Stay (HOSPITAL_COMMUNITY): Payer: Medicaid Other | Admitting: Anesthesiology

## 2018-06-29 ENCOUNTER — Encounter (HOSPITAL_COMMUNITY): Payer: Self-pay | Admitting: *Deleted

## 2018-06-29 ENCOUNTER — Inpatient Hospital Stay (HOSPITAL_COMMUNITY)
Admission: AD | Admit: 2018-06-29 | Discharge: 2018-06-30 | DRG: 807 | Disposition: A | Discharge status: Home / Self Care | Payer: Medicaid Other | Attending: Obstetrics and Gynecology | Admitting: Obstetrics and Gynecology

## 2018-06-29 ENCOUNTER — Other Ambulatory Visit: Payer: Self-pay

## 2018-06-29 DIAGNOSIS — Z87891 Personal history of nicotine dependence: Secondary | ICD-10-CM

## 2018-06-29 DIAGNOSIS — Z3483 Encounter for supervision of other normal pregnancy, third trimester: Secondary | ICD-10-CM | POA: Diagnosis present

## 2018-06-29 DIAGNOSIS — Z3490 Encounter for supervision of normal pregnancy, unspecified, unspecified trimester: Secondary | ICD-10-CM

## 2018-06-29 DIAGNOSIS — O0930 Supervision of pregnancy with insufficient antenatal care, unspecified trimester: Secondary | ICD-10-CM

## 2018-06-29 DIAGNOSIS — Z3A38 38 weeks gestation of pregnancy: Secondary | ICD-10-CM | POA: Diagnosis not present

## 2018-06-29 HISTORY — DX: Sleep apnea, unspecified: G47.30

## 2018-06-29 HISTORY — DX: Anemia, unspecified: D64.9

## 2018-06-29 LAB — CBC
HEMATOCRIT: 36.4 % (ref 36.0–46.0)
HEMOGLOBIN: 12.1 g/dL (ref 12.0–15.0)
MCH: 27.3 pg (ref 26.0–34.0)
MCHC: 33.2 g/dL (ref 30.0–36.0)
MCV: 82.2 fL (ref 80.0–100.0)
Platelets: 194 10*3/uL (ref 150–400)
RBC: 4.43 MIL/uL (ref 3.87–5.11)
RDW: 14.7 % (ref 11.5–15.5)
WBC: 9.3 10*3/uL (ref 4.0–10.5)

## 2018-06-29 LAB — ABO/RH: ABO/RH(D): O POS

## 2018-06-29 LAB — POCT FERN TEST: POCT Fern Test: POSITIVE

## 2018-06-29 LAB — TYPE AND SCREEN
ABO/RH(D): O POS
ANTIBODY SCREEN: NEGATIVE

## 2018-06-29 LAB — RPR: RPR: NONREACTIVE

## 2018-06-29 MED ORDER — BENZOCAINE-MENTHOL 20-0.5 % EX AERO: 1.0000 "application " | INHALATION_SPRAY | CUTANEOUS | Status: DC | PRN

## 2018-06-29 MED ORDER — ONDANSETRON HCL 4 MG/2ML IJ SOLN: 4.0000 mg | INTRAMUSCULAR | Status: DC | PRN

## 2018-06-29 MED ORDER — ONDANSETRON HCL 4 MG/2ML IJ SOLN
4.0000 mg | Freq: Four times a day (QID) | INTRAMUSCULAR | Status: DC | PRN
  Administered 2018-06-29: 4 mg via INTRAVENOUS
  Filled 2018-06-29: qty 2

## 2018-06-29 MED ORDER — EPHEDRINE 5 MG/ML INJ
10.0000 mg | INTRAVENOUS | Status: DC | PRN
  Filled 2018-06-29: qty 2

## 2018-06-29 MED ORDER — ACETAMINOPHEN 325 MG PO TABS: 650.0000 mg | ORAL_TABLET | ORAL | Status: DC | PRN

## 2018-06-29 MED ORDER — SOD CITRATE-CITRIC ACID 500-334 MG/5ML PO SOLN: 30.0000 mL | ORAL | Status: DC | PRN

## 2018-06-29 MED ORDER — OXYTOCIN BOLUS FROM INFUSION
500.0000 mL | Freq: Once | INTRAVENOUS | Status: AC
  Administered 2018-06-29: 500 mL via INTRAVENOUS

## 2018-06-29 MED ORDER — OXYCODONE-ACETAMINOPHEN 5-325 MG PO TABS: 2.0000 | ORAL_TABLET | ORAL | Status: DC | PRN

## 2018-06-29 MED ORDER — LIDOCAINE HCL (PF) 1 % IJ SOLN
30.0000 mL | INTRAMUSCULAR | Status: DC | PRN
  Filled 2018-06-29: qty 30

## 2018-06-29 MED ORDER — LACTATED RINGERS IV SOLN: 500.0000 mL | Freq: Once | INTRAVENOUS | Status: DC

## 2018-06-29 MED ORDER — PHENYLEPHRINE 40 MCG/ML (10ML) SYRINGE FOR IV PUSH (FOR BLOOD PRESSURE SUPPORT)
80.0000 ug | PREFILLED_SYRINGE | INTRAVENOUS | Status: DC | PRN
  Filled 2018-06-29: qty 5

## 2018-06-29 MED ORDER — OXYCODONE-ACETAMINOPHEN 5-325 MG PO TABS: 1.0000 | ORAL_TABLET | ORAL | Status: DC | PRN

## 2018-06-29 MED ORDER — DIBUCAINE 1 % RE OINT: 1.0000 "application " | TOPICAL_OINTMENT | RECTAL | Status: DC | PRN

## 2018-06-29 MED ORDER — FLEET ENEMA 7-19 GM/118ML RE ENEM: 1.0000 | ENEMA | Freq: Every day | RECTAL | Status: DC | PRN

## 2018-06-29 MED ORDER — MEASLES, MUMPS & RUBELLA VAC IJ SOLR: 0.5000 mL | Freq: Once | INTRAMUSCULAR | Status: DC

## 2018-06-29 MED ORDER — LACTATED RINGERS IV SOLN: 500.0000 mL | INTRAVENOUS | Status: DC | PRN

## 2018-06-29 MED ORDER — BISACODYL 10 MG RE SUPP: 10.0000 mg | Freq: Every day | RECTAL | Status: DC | PRN

## 2018-06-29 MED ORDER — DIPHENHYDRAMINE HCL 25 MG PO CAPS: 25.0000 mg | ORAL_CAPSULE | Freq: Four times a day (QID) | ORAL | Status: DC | PRN

## 2018-06-29 MED ORDER — OXYCODONE HCL 5 MG PO TABS: 10.0000 mg | ORAL_TABLET | ORAL | Status: DC | PRN

## 2018-06-29 MED ORDER — WITCH HAZEL-GLYCERIN EX PADS: 1.0000 "application " | MEDICATED_PAD | CUTANEOUS | Status: DC | PRN

## 2018-06-29 MED ORDER — COCONUT OIL OIL: 1.0000 "application " | TOPICAL_OIL | Status: DC | PRN

## 2018-06-29 MED ORDER — LIDOCAINE HCL (PF) 1 % IJ SOLN
INTRAMUSCULAR | Status: DC | PRN
  Administered 2018-06-29 (×2): 5 mL via EPIDURAL

## 2018-06-29 MED ORDER — SIMETHICONE 80 MG PO CHEW: 80.0000 mg | CHEWABLE_TABLET | ORAL | Status: DC | PRN

## 2018-06-29 MED ORDER — FERROUS SULFATE 325 (65 FE) MG PO TABS
325.0000 mg | ORAL_TABLET | Freq: Two times a day (BID) | ORAL | Status: DC
  Administered 2018-06-29 – 2018-06-30 (×4): 325 mg via ORAL
  Filled 2018-06-29 (×4): qty 1

## 2018-06-29 MED ORDER — PRENATAL MULTIVITAMIN CH
1.0000 | ORAL_TABLET | Freq: Every day | ORAL | Status: DC
  Administered 2018-06-29 – 2018-06-30 (×2): 1 via ORAL
  Filled 2018-06-29 (×2): qty 1

## 2018-06-29 MED ORDER — TETANUS-DIPHTH-ACELL PERTUSSIS 5-2.5-18.5 LF-MCG/0.5 IM SUSP: 0.5000 mL | Freq: Once | INTRAMUSCULAR | Status: DC

## 2018-06-29 MED ORDER — LACTATED RINGERS IV SOLN
500.0000 mL | Freq: Once | INTRAVENOUS | Status: AC
  Administered 2018-06-29: 500 mL via INTRAVENOUS

## 2018-06-29 MED ORDER — ZOLPIDEM TARTRATE 5 MG PO TABS: 5.0000 mg | ORAL_TABLET | Freq: Every evening | ORAL | Status: DC | PRN

## 2018-06-29 MED ORDER — FENTANYL 2.5 MCG/ML BUPIVACAINE 1/10 % EPIDURAL INFUSION (WH - ANES)
14.0000 mL/h | INTRAMUSCULAR | Status: DC | PRN
  Administered 2018-06-29: 14 mL/h via EPIDURAL
  Filled 2018-06-29: qty 100

## 2018-06-29 MED ORDER — DIPHENHYDRAMINE HCL 50 MG/ML IJ SOLN: 12.5000 mg | INTRAMUSCULAR | Status: DC | PRN

## 2018-06-29 MED ORDER — DOCUSATE SODIUM 100 MG PO CAPS
100.0000 mg | ORAL_CAPSULE | Freq: Two times a day (BID) | ORAL | Status: DC
  Administered 2018-06-29 – 2018-06-30 (×2): 100 mg via ORAL
  Filled 2018-06-29 (×2): qty 1

## 2018-06-29 MED ORDER — OXYTOCIN 40 UNITS IN LACTATED RINGERS INFUSION - SIMPLE MED
2.5000 [IU]/h | INTRAVENOUS | Status: DC
  Filled 2018-06-29: qty 1000

## 2018-06-29 MED ORDER — METHYLERGONOVINE MALEATE 0.2 MG/ML IJ SOLN: 0.2000 mg | INTRAMUSCULAR | Status: DC | PRN

## 2018-06-29 MED ORDER — PHENYLEPHRINE 40 MCG/ML (10ML) SYRINGE FOR IV PUSH (FOR BLOOD PRESSURE SUPPORT)
80.0000 ug | PREFILLED_SYRINGE | INTRAVENOUS | Status: DC | PRN
  Filled 2018-06-29: qty 5
  Filled 2018-06-29: qty 10

## 2018-06-29 MED ORDER — ONDANSETRON HCL 4 MG PO TABS: 4.0000 mg | ORAL_TABLET | ORAL | Status: DC | PRN

## 2018-06-29 MED ORDER — OXYCODONE HCL 5 MG PO TABS: 5.0000 mg | ORAL_TABLET | ORAL | Status: DC | PRN

## 2018-06-29 MED ORDER — METHYLERGONOVINE MALEATE 0.2 MG PO TABS: 0.2000 mg | ORAL_TABLET | ORAL | Status: DC | PRN

## 2018-06-29 MED ORDER — IBUPROFEN 600 MG PO TABS
600.0000 mg | ORAL_TABLET | Freq: Four times a day (QID) | ORAL | Status: DC
  Administered 2018-06-29 – 2018-06-30 (×6): 600 mg via ORAL
  Filled 2018-06-29 (×6): qty 1

## 2018-06-29 MED ORDER — LACTATED RINGERS IV SOLN
INTRAVENOUS | Status: DC
  Administered 2018-06-29: 02:00:00 via INTRAVENOUS

## 2018-06-29 NOTE — Anesthesia Postprocedure Evaluation (Signed)
Anesthesia Post Note  Patient: Programmer, systemsenada Zajkowski  Procedure(s) Performed: AN AD HOC LABOR EPIDURAL     Patient location during evaluation: Mother Baby Anesthesia Type: Epidural Level of consciousness: awake, awake and alert and oriented Pain management: pain level controlled Vital Signs Assessment: post-procedure vital signs reviewed and stable Respiratory status: spontaneous breathing, nonlabored ventilation and respiratory function stable Cardiovascular status: stable Postop Assessment: no headache, no backache, adequate PO intake, able to ambulate, no apparent nausea or vomiting and patient able to bend at knees Anesthetic complications: no    Last Vitals:  Vitals:   06/29/18 0556 06/29/18 0653  BP: 104/67 108/72  Pulse: 70 71  Resp: 16 16  Temp: 36.7 C 36.4 C  SpO2: 100% 100%    Last Pain:  Vitals:   06/29/18 0653  TempSrc: Oral  PainSc: 0-No pain   Pain Goal: Patients Stated Pain Goal: 0 (06/29/18 0050)               Leeta Grimme

## 2018-06-29 NOTE — Anesthesia Procedure Notes (Signed)
Epidural Patient location during procedure: OB Start time: 06/29/2018 2:38 AM End time: 06/29/2018 2:47 AM  Staffing Anesthesiologist: Achille RichHodierne, Lacye Mccarn, MD Performed: anesthesiologist   Preanesthetic Checklist Completed: patient identified, site marked, pre-op evaluation, timeout performed, IV checked, risks and benefits discussed and monitors and equipment checked  Epidural Patient position: sitting Prep: DuraPrep Patient monitoring: heart rate, cardiac monitor, continuous pulse ox and blood pressure Approach: midline Location: L2-L3 Injection technique: LOR saline  Needle:  Needle type: Tuohy  Needle gauge: 17 G Needle length: 9 cm Needle insertion depth: 5 cm Catheter type: closed end flexible Catheter size: 19 Gauge Catheter at skin depth: 11 cm Test dose: negative and Other  Assessment Events: blood not aspirated, injection not painful, no injection resistance and negative IV test  Additional Notes Informed consent obtained prior to proceeding including risk of failure, 1% risk of PDPH, risk of minor discomfort and bruising.  Discussed rare but serious complications including epidural abscess, permanent nerve injury, epidural hematoma.  Discussed alternatives to epidural analgesia and patient desires to proceed.  Timeout performed pre-procedure verifying patient name, procedure, and platelet count.  Patient tolerated procedure well. Reason for block:procedure for pain

## 2018-06-29 NOTE — MAU Provider Note (Signed)
Pt informed that the ultrasound is considered a limited OB ultrasound and is not intended to be a complete ultrasound exam.  Patient also informed that the ultrasound is not being completed with the intent of assessing for fetal or placental anomalies or any pelvic abnormalities.  Explained that the purpose of today's ultrasound is to assess for presentation  Patient acknowledges the purpose of the exam and the limitations of the study.    Fetus is presenting vertex

## 2018-06-29 NOTE — H&P (Addendum)
LABOR AND DELIVERY ADMISSION HISTORY AND PHYSICAL NOTE  Loretta Brewer is a 32 y.o. female [id number] with IUP at [redacted]w[redacted]d by 33 week U/S presenting for SROM. Reports ROM around midnight with clear fluid with contractions every 3 minutes, confirmed with positive fern test in the MAU.  She reports positive fetal movement. She denies vaginal bleeding.  Prenatal History/Complications: PNC at [facility].  Pregnancy complications:  - Late to establish prenatal care ([redacted]w[redacted]d) - Tobacco (5 cig/day), MJ, and alcohol use (2x/week- few shots/bottle of beer) until August  - 1 elevated blood pressure in the MAU   Past Medical History: Past Medical History:  Diagnosis Date  . Anemia   . Sleep apnea     Past Surgical History: Past Surgical History:  Procedure Laterality Date  . DENTAL SURGERY      Obstetrical History: OB History    Gravida  5   Para      Term      Preterm      AB  2   Living  2     SAB      TAB  2   Ectopic      Multiple      Live Births              Social History: Social History   Socioeconomic History  . Marital status: Single    Spouse name: Not on file  . Number of children: Not on file  . Years of education: Not on file  . Highest education level: Not on file  Occupational History  . Not on file  Social Needs  . Financial resource strain: Not on file  . Food insecurity:    Worry: Not on file    Inability: Not on file  . Transportation needs:    Medical: Not on file    Non-medical: Not on file  Tobacco Use  . Smoking status: Former Smoker    Packs/day: 0.25    Types: Cigarettes  . Smokeless tobacco: Never Used  Substance and Sexual Activity  . Alcohol use: Not Currently    Comment: early in pregnacy not anymore "been a couple months"  . Drug use: Not Currently    Types: Marijuana    Comment: late July / early Aug   . Sexual activity: Yes    Partners: Male    Birth control/protection: None  Lifestyle  . Physical activity:    Days  per week: Not on file    Minutes per session: Not on file  . Stress: Not on file  Relationships  . Social connections:    Talks on phone: Not on file    Gets together: Not on file    Attends religious service: Not on file    Active member of club or organization: Not on file    Attends meetings of clubs or organizations: Not on file    Relationship status: Not on file  Other Topics Concern  . Not on file  Social History Narrative  . Not on file    Family History: Family History  Problem Relation Age of Onset  . Hypertension Father     Allergies: No Known Allergies  Medications Prior to Admission  Medication Sig Dispense Refill Last Dose  . ferrous sulfate 325 (65 FE) MG tablet Take 1 tablet (325 mg total) by mouth 2 (two) times daily with a meal. 60 tablet 5 06/28/2018 at Unknown time  . prenatal vitamin w/FE, FA (PRENATAL 1 + 1) 27-1 MG  TABS tablet Take 1 tablet by mouth daily before breakfast. 90 each 3 06/28/2018 at Unknown time     Review of Systems  All systems reviewed and negative except as stated in HPI  Physical Exam Last menstrual period 11/04/2017. General appearance: alert, oriented, NAD Lungs: normal respiratory effort Heart: regular rate Abdomen: soft, non-tender; gravid, FH appropriate for GA Extremities: No calf swelling or tenderness Presentation: cephalic Uterine activity: every 2-3  Dilation: 3 Effacement (%): 70, 80 Station: -2 Exam by:: [name], RN  Prenatal labs: ABO, Rh: O/Positive/-- (09/23 1136) Antibody: Negative (09/23 1136) Rubella: 8.18 (09/23 1136) RPR: Non Reactive (10/08 1035)  HBsAg: Negative (09/23 1136)  HIV: Non Reactive (10/08 1035)  GC/Chlamydia: Negative GBS: Negative (10/30 1031)  2-hr GTT: Normal  Genetic screening: Normal  Anatomy US: Normal female   Prenatal Transfer Tool  Maternal Diabetes: No Genetic Screening: Normal Maternal Ultrasounds/Referrals: Normal Fetal Ultrasounds or other Referrals:   None Maternal Substance Abuse:  Tobacco/MJ/alcohol use until August Significant Maternal Medications:  None Significant Maternal Lab Results: Lab values include: Group B Strep negative  Results for orders placed or performed during the hospital encounter of 06/29/18 (from the past 24 hour(s))  Fern Test   Collection Time: 06/29/18  1:05 AM  Result Value Ref Range   POCT Fern Test Positive = ruptured amniotic membanes     Patient Active Problem List   Diagnosis Date Noted  . Supervision of normal pregnancy 05/07/2018    Assessment: [name] Brewer is a 32 y.o. [id number] at 819w2d here for SROM. Pregnancy complicated by late prenatal care (26 weeks) and tobacco, MJ, and alcohol use until August. GBS negative.   #Labor: Expectant management, serial cervical exams  #Pain: Epidural  #FWB: Cat 1 strip  #ID: GBS negative  #MOF: Breast and bottle feeding  #MOC: Depo  #Circ: N/A  1. Late prenatal care: At 26 weeks, states she was considering not keeping pregnancy so waited awhile to establish care. On arrival, patient was also tearful that her family did not support her well.   -SW consult   2. Elevated Blood Pressure: Only 1 value, 133/91 in the MAU, however in pain with contractions and upset family was not present. No history of hypertensive disorder.   -Monitor BP   Allayne StackSamantha N Beard 06/29/2018, 1:20 AM   I personally saw and evaluated the patient, performing the key elements of the service. I developed and verified the management plan that is described in the resident's/student's note, and I agree with the content with my edits above.  Cathie BeamsFran Cresenzo-Dishmon, CNM 07/03/2018 9:09 AM

## 2018-06-29 NOTE — Anesthesia Preprocedure Evaluation (Signed)
Anesthesia Evaluation  Patient identified by MRN, date of birth, ID band Patient awake    Reviewed: Allergy & Precautions, H&P , NPO status , Patient's Chart, lab work & pertinent test results  Airway Mallampati: II   Neck ROM: full    Dental   Pulmonary former smoker,    breath sounds clear to auscultation       Cardiovascular negative cardio ROS   Rhythm:regular Rate:Normal     Neuro/Psych    GI/Hepatic   Endo/Other    Renal/GU      Musculoskeletal   Abdominal   Peds  Hematology   Anesthesia Other Findings   Reproductive/Obstetrics (+) Pregnancy                             Anesthesia Physical Anesthesia Plan  ASA: II  Anesthesia Plan: Epidural   Post-op Pain Management:    Induction: Intravenous  PONV Risk Score and Plan: 2 and Treatment may vary due to age or medical condition  Airway Management Planned: Natural Airway  Additional Equipment:   Intra-op Plan:   Post-operative Plan:   Informed Consent: I have reviewed the patients History and Physical, chart, labs and discussed the procedure including the risks, benefits and alternatives for the proposed anesthesia with the patient or authorized representative who has indicated his/her understanding and acceptance.       Plan Discussed with: Anesthesiologist  Anesthesia Plan Comments:         Anesthesia Quick Evaluation  

## 2018-06-29 NOTE — MAU Note (Signed)
Pt presents to MAU c/o possible SROM around 0000 clear fluid. Pt also reports frequent contractions. Pt is tearful stating she wishes she had more help from her family.

## 2018-06-30 DIAGNOSIS — O0930 Supervision of pregnancy with insufficient antenatal care, unspecified trimester: Secondary | ICD-10-CM

## 2018-06-30 MED ORDER — IBUPROFEN 600 MG PO TABS: 600.0000 mg | ORAL_TABLET | Freq: Four times a day (QID) | ORAL | 0 refills | Status: DC | PRN

## 2018-06-30 NOTE — Discharge Summary (Signed)
OB Discharge Summary     Patient Name: Loretta Brewer DOB: [birth year] MRN: [id number]  Date of admission: 06/29/2018 Delivering MD: Allayne Stack   Date of discharge: 06/30/2018  Admitting diagnosis: 38WKS WATER BROKE,CTX Intrauterine pregnancy: [redacted]w[redacted]d     Secondary diagnosis:  Active Problems:   Supervision of normal pregnancy   Labor and delivery, indication for care   Late prenatal care  Additional problems: maternal tobacco, marijuana, and alcohol use through second trimester     Discharge diagnosis: Term Pregnancy Delivered                                                                                                Post partum procedures:none  Augmentation: none  Complications: None  Hospital course:  Onset of Labor With Vaginal Delivery     32 y.o. yo [id number] at [redacted]w[redacted]d was admitted in Latent Labor on 06/29/2018 and went on to deliver precipitously within a couple of hours. Patient had an uncomplicated labor course as follows:  Membrane Rupture Time/Date: 12:00 AM ,06/29/2018   Intrapartum Procedures: Episiotomy: None [1]                                         Lacerations:  None [1]  Patient had a delivery of a Viable infant. 06/29/2018  Information for the patient's newborn:  Bettye, Sitton [[id number]]  Delivery Method: Vag-Spont    Pateint had an uncomplicated postpartum course.  She is ambulating, tolerating a regular diet, passing flatus, and urinating well. Patient is discharged home in stable condition on 06/30/18 on PPD#1 per pt request if infant is stable for discharge. She was risked out of a SW consult.   Physical exam  Vitals:   06/29/18 1300 06/29/18 1734 06/29/18 2105 06/30/18 0459  BP: 115/73 102/74 105/68 104/67  Pulse: 63 64 64 74  Resp: (!) 1 18 18 18   Temp: 98.1 F (36.7 C) 98 F (36.7 C) 98.1 F (36.7 C) 98.2 F (36.8 C)  TempSrc:  Oral Oral Oral  SpO2: 100% 100% 100% 100%  Weight:      Height:       General: alert and  cooperative Lochia: appropriate Uterine Fundus: firm Incision: N/A DVT Evaluation: No evidence of DVT seen on physical exam. Labs: Lab Results  Component Value Date   WBC 9.3 06/29/2018   HGB 12.1 06/29/2018   HCT 36.4 06/29/2018   MCV 82.2 06/29/2018   PLT 194 06/29/2018   CMP Latest Ref Rng & Units 11/18/2017  Glucose 65 - 99 mg/dL 440(N)  BUN 6 - 20 mg/dL 12  Creatinine 0.27 - 2.53 mg/dL 6.64  Sodium 403 - 474 mmol/L 137  Potassium 3.5 - 5.1 mmol/L 3.6  Chloride 101 - 111 mmol/L 104  CO2 22 - 32 mmol/L 24  Calcium 8.9 - 10.3 mg/dL 9.0  Total Protein 6.5 - 8.1 g/dL -  Total Bilirubin 0.3 - 1.2 mg/dL -  Alkaline Phos 38 - 259 U/L -  AST 15 -  41 U/L -  ALT 14 - 54 U/L -    Discharge instruction: per After Visit Summary and "Baby and Me Booklet".  After visit meds:  Allergies as of 06/30/2018   No Known Allergies     Medication List    STOP taking these medications   cimetidine 200 MG tablet Commonly known as:  TAGAMET     TAKE these medications   ferrous sulfate 325 (65 FE) MG tablet Take 1 tablet (325 mg total) by mouth 2 (two) times daily with a meal.   ibuprofen 600 MG tablet Commonly known as:  ADVIL,MOTRIN Take 1 tablet (600 mg total) by mouth every 6 (six) hours as needed.   prenatal vitamin w/FE, FA 27-1 MG Tabs tablet Take 1 tablet by mouth daily before breakfast.       Diet: routine diet  Activity: Advance as tolerated. Pelvic rest for 6 weeks.   Outpatient follow up:4 weeks Follow up Appt: Future Appointments  Date Time Provider Department Center  07/27/2018  9:00 AM Brock BadHarper, Charles A, MD [facility] None   Follow up Visit:No follow-ups on file.  Postpartum contraception: Depo Provera  Newborn Data: Live born female  Birth Weight: 5 lb 15.4 oz (2705 g) APGAR: 8, 9  Newborn Delivery   Birth date/time:  06/29/2018 03:13:00 Delivery type:  Vaginal, Spontaneous     Baby Feeding: Bottle Disposition:home with  mother   06/30/2018 Arabella MerlesKimberly D , CNM  10:23 AM

## 2018-06-30 NOTE — Discharge Instructions (Signed)
Vaginal Delivery, Care After °Refer to this sheet in the next few weeks. These instructions provide you with information about caring for yourself after vaginal delivery. Your health care provider may also give you more specific instructions. Your treatment has been planned according to current medical practices, but problems sometimes occur. Call your health care provider if you have any problems or questions. °What can I expect after the procedure? °After vaginal delivery, it is common to have: °· Some bleeding from your vagina. °· Soreness in your abdomen, your vagina, and the area of skin between your vaginal opening and your anus (perineum). °· Pelvic cramps. °· Fatigue. ° °Follow these instructions at home: °Medicines °· Take over-the-counter and prescription medicines only as told by your health care provider. °· If you were prescribed an antibiotic medicine, take it as told by your health care provider. Do not stop taking the antibiotic until it is finished. °Driving ° °· Do not drive or operate heavy machinery while taking prescription pain medicine. °· Do not drive for 24 hours if you received a sedative. °Lifestyle °· Do not drink alcohol. This is especially important if you are breastfeeding or taking medicine to relieve pain. °· Do not use tobacco products, including cigarettes, chewing tobacco, or e-cigarettes. If you need help quitting, ask your health care provider. °Eating and drinking °· Drink at least 8 eight-ounce glasses of water every day unless you are told not to by your health care provider. If you choose to breastfeed your baby, you may need to drink more water than this. °· Eat high-fiber foods every day. These foods may help prevent or relieve constipation. High-fiber foods include: °? Whole grain cereals and breads. °? Brown rice. °? Beans. °? Fresh fruits and vegetables. °Activity °· Return to your normal activities as told by your health care provider. Ask your health care provider  what activities are safe for you. °· Rest as much as possible. Try to rest or take a nap when your baby is sleeping. °· Do not lift anything that is heavier than your baby or 10 lb (4.5 kg) until your health care provider says that it is safe. °· Talk with your health care provider about when you can engage in sexual activity. This may depend on your: °? Risk of infection. °? Rate of healing. °? Comfort and desire to engage in sexual activity. °Vaginal Care °· If you have an episiotomy or a vaginal tear, check the area every day for signs of infection. Check for: °? More redness, swelling, or pain. °? More fluid or blood. °? Warmth. °? Pus or a bad smell. °· Do not use tampons or douches until your health care provider says this is safe. °· Watch for any blood clots that may pass from your vagina. These may look like clumps of dark red, brown, or black discharge. °General instructions °· Keep your perineum clean and dry as told by your health care provider. °· Wear loose, comfortable clothing. °· Wipe from front to back when you use the toilet. °· Ask your health care provider if you can shower or take a bath. If you had an episiotomy or a perineal tear during labor and delivery, your health care provider may tell you not to take baths for a certain length of time. °· Wear a bra that supports your breasts and fits you well. °· If possible, have someone help you with household activities and help care for your baby for at least a few days after   you leave the hospital. °· Keep all follow-up visits for you and your baby as told by your health care provider. This is important. °Contact a health care provider if: °· You have: °? Vaginal discharge that has a bad smell. °? Difficulty urinating. °? Pain when urinating. °? A sudden increase or decrease in the frequency of your bowel movements. °? More redness, swelling, or pain around your episiotomy or vaginal tear. °? More fluid or blood coming from your episiotomy or  vaginal tear. °? Pus or a bad smell coming from your episiotomy or vaginal tear. °? A fever. °? A rash. °? Little or no interest in activities you used to enjoy. °? Questions about caring for yourself or your baby. °· Your episiotomy or vaginal tear feels warm to the touch. °· Your episiotomy or vaginal tear is separating or does not appear to be healing. °· Your breasts are painful, hard, or turn red. °· You feel unusually sad or worried. °· You feel nauseous or you vomit. °· You pass large blood clots from your vagina. If you pass a blood clot from your vagina, save it to show to your health care provider. Do not flush blood clots down the toilet without having your health care provider look at them. °· You urinate more than usual. °· You are dizzy or light-headed. °· You have not breastfed at all and you have not had a menstrual period for 12 weeks after delivery. °· You have stopped breastfeeding and you have not had a menstrual period for 12 weeks after you stopped breastfeeding. °Get help right away if: °· You have: °? Pain that does not go away or does not get better with medicine. °? Chest pain. °? Difficulty breathing. °? Blurred vision or spots in your vision. °? Thoughts about hurting yourself or your baby. °· You develop pain in your abdomen or in one of your legs. °· You develop a severe headache. °· You faint. °· You bleed from your vagina so much that you fill two sanitary pads in one hour. °This information is not intended to replace advice given to you by your health care provider. Make sure you discuss any questions you have with your health care provider. °Document Released: 07/29/2000 Document Revised: 01/13/2016 Document Reviewed: 08/16/2015 °Elsevier Interactive Patient Education © 2018 [employer] ° °
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
## 2018-06-30 NOTE — Progress Notes (Signed)
CSW received consult for late and limited PNC.  CSW reviewed chart and is screening out consult as it does not meet criteria for automatic CSW involvement and infant drug screening.  MOB started care prior to 28 weeks and had more than 3 visits.  Please contact CSW if current concerns arise or by MOB's request.  CSW will see MOB for concerns with family support and anxiety.   Yohannes Waibel, LCSW Clinical Social Worker  [employer]  [phone]  

## 2018-07-04 ENCOUNTER — Encounter: Payer: Medicaid Other | Admitting: Obstetrics & Gynecology

## 2018-07-04 ENCOUNTER — Telehealth (HOSPITAL_COMMUNITY): Payer: Self-pay | Admitting: *Deleted

## 2018-07-04 NOTE — Telephone Encounter (Signed)
Preadmission screen  

## 2018-07-10 ENCOUNTER — Encounter: Payer: Medicaid Other | Admitting: Obstetrics

## 2018-07-18 ENCOUNTER — Inpatient Hospital Stay (HOSPITAL_COMMUNITY): Admission: RE | Admit: 2018-07-18 | Payer: Medicaid Other | Source: Ambulatory Visit

## 2018-07-23 ENCOUNTER — Emergency Department (HOSPITAL_COMMUNITY)
Admission: EM | Admit: 2018-07-23 | Discharge: 2018-07-23 | Discharge status: Against Medical Advice | Payer: Medicaid Other | Attending: Emergency Medicine | Admitting: Emergency Medicine

## 2018-07-23 ENCOUNTER — Encounter (HOSPITAL_COMMUNITY): Payer: Self-pay

## 2018-07-23 DIAGNOSIS — F10929 Alcohol use, unspecified with intoxication, unspecified: Secondary | ICD-10-CM | POA: Insufficient documentation

## 2018-07-23 DIAGNOSIS — Z87891 Personal history of nicotine dependence: Secondary | ICD-10-CM | POA: Diagnosis not present

## 2018-07-23 DIAGNOSIS — R410 Disorientation, unspecified: Secondary | ICD-10-CM

## 2018-07-23 LAB — CBC
HEMATOCRIT: 39.5 % (ref 36.0–46.0)
HEMOGLOBIN: 12.2 g/dL (ref 12.0–15.0)
MCH: 27.2 pg (ref 26.0–34.0)
MCHC: 30.9 g/dL (ref 30.0–36.0)
MCV: 88.2 fL (ref 80.0–100.0)
NRBC: 0 % (ref 0.0–0.2)
Platelets: 246 10*3/uL (ref 150–400)
RBC: 4.48 MIL/uL (ref 3.87–5.11)
RDW: 14.1 % (ref 11.5–15.5)
WBC: 7 10*3/uL (ref 4.0–10.5)

## 2018-07-23 LAB — I-STAT CHEM 8, ED
BUN: 8 mg/dL (ref 6–20)
CHLORIDE: 106 mmol/L (ref 98–111)
Calcium, Ion: 1.18 mmol/L (ref 1.15–1.40)
Creatinine, Ser: 1 mg/dL (ref 0.44–1.00)
GLUCOSE: 88 mg/dL (ref 70–99)
HCT: 39 % (ref 36.0–46.0)
Hemoglobin: 13.3 g/dL (ref 12.0–15.0)
POTASSIUM: 3.8 mmol/L (ref 3.5–5.1)
SODIUM: 143 mmol/L (ref 135–145)
TCO2: 26 mmol/L (ref 22–32)

## 2018-07-23 LAB — ETHANOL: ALCOHOL ETHYL (B): 194 mg/dL — AB (ref <10)

## 2018-07-23 MED ORDER — SODIUM CHLORIDE 0.9 % IV BOLUS: 1000.0000 mL | Freq: Once | INTRAVENOUS | Status: DC
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
## 2018-07-23 NOTE — ED Notes (Signed)
PT DEMANDING TO LEAVE, CHASED PT DOWN BACK HALLWAY AND MADE HER COME BACK TO GET IV OUT OF ARM. PROVIDER NOTIFIED LEAVING AMA

## 2018-07-23 NOTE — ED Notes (Signed)
Pt very unhappy with nurse, feels like not willing to help her. Attempted to speak with pt but she walked away to complain about service and demanding paper work , but pt left AMA and then came back to Ed.
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
## 2018-07-23 NOTE — ED Notes (Addendum)
[facility] and began yelling and cursing about her care. [facility] Patient alert and ambulatory in no distress. Patient refusing to sign.

## 2018-07-26 ENCOUNTER — Ambulatory Visit (INDEPENDENT_AMBULATORY_CARE_PROVIDER_SITE_OTHER): Payer: Medicaid Other | Admitting: Obstetrics

## 2018-07-26 ENCOUNTER — Encounter: Payer: Self-pay | Admitting: Obstetrics

## 2018-07-26 DIAGNOSIS — Z3202 Encounter for pregnancy test, result negative: Secondary | ICD-10-CM

## 2018-07-26 DIAGNOSIS — Z1389 Encounter for screening for other disorder: Secondary | ICD-10-CM | POA: Diagnosis not present

## 2018-07-26 DIAGNOSIS — Z3042 Encounter for surveillance of injectable contraceptive: Secondary | ICD-10-CM

## 2018-07-26 LAB — POCT URINE PREGNANCY: Preg Test, Ur: NEGATIVE

## 2018-07-26 MED ORDER — MEDROXYPROGESTERONE ACETATE 150 MG/ML IM SUSP: 150.0000 mg | Freq: Once | INTRAMUSCULAR | Status: DC

## 2018-07-26 MED ORDER — MEDROXYPROGESTERONE ACETATE 150 MG/ML IM SUSP: 150.0000 mg | INTRAMUSCULAR | 4 refills | Status: DC

## 2018-07-26 NOTE — Progress Notes (Signed)
Post Partum Exam  Loretta Brewer is a 32 y.o. [id number] female who presents for a postpartum visit. She is 3 weeks postpartum following a spontaneous vaginal delivery. I have fully reviewed the prenatal and intrapartum course. The delivery was at 38 gestational weeks.  Anesthesia: epidural. Postpartum course has been uncomplicated. Baby's course has been uncomplicated. Baby is feeding by bottle - Gerber Gentle Pro. Bleeding staining only. Bowel function is normal. Bladder function is normal. Patient is not sexually active. Contraception method is none. Postpartum depression screening:neg  The following portions of the patient's history were reviewed and updated as appropriate: allergies, current medications, past family history, past medical history, past social history, past surgical history and problem list. Last pap smear done 2018 and was Normal  Review of Systems A comprehensive review of systems was negative.    Objective:  Blood pressure 100/64, pulse 94, height 5\' 6"  (1.676 m), weight 143 lb (64.9 kg), last menstrual period 11/04/2017, not currently breastfeeding.  General:  alert and no distress   Breasts:  inspection negative, no nipple discharge or bleeding, no masses or nodularity palpable  Lungs: clear to auscultation bilaterally  Heart:  regular rate and rhythm, S1, S2 normal, no murmur, click, rub or gallop  Abdomen: soft, non-tender; bowel sounds normal; no masses,  no organomegaly   Assessment:   1. Postpartum care following vaginal delivery - DOING WELL  2. Encounter for surveillance of injectable contraceptive Rx: - medroxyPROGESTERone (DEPO-PROVERA) injection 150 mg - POCT urine pregnancy - medroxyPROGESTERone (DEPO-PROVERA) 150 MG/ML injection; Inject 1 mL (150 mg total) into the muscle every 3 (three) months.  Dispense: 1 mL; Refill: 4  Plan:   1. Contraception: Depo-Provera injections 2. Continue PNV's 3. Follow up in: 3 months or as needed.    Loretta BadHARLES A.  HARPER MD 07-26-2018

## 2018-07-27 ENCOUNTER — Ambulatory Visit: Payer: Medicaid Other | Admitting: Obstetrics

## 2018-10-17 ENCOUNTER — Ambulatory Visit (INDEPENDENT_AMBULATORY_CARE_PROVIDER_SITE_OTHER): Payer: Medicaid Other

## 2018-10-17 DIAGNOSIS — Z3042 Encounter for surveillance of injectable contraceptive: Secondary | ICD-10-CM

## 2018-10-17 MED ORDER — MEDROXYPROGESTERONE ACETATE 150 MG/ML IM SUSP
150.0000 mg | INTRAMUSCULAR | Status: DC
  Administered 2018-10-17 – 2021-01-26 (×6): 150 mg via INTRAMUSCULAR

## 2018-10-17 NOTE — Progress Notes (Signed)
Pt is in the office for depo injection, administered in [place] and pt tolerated well. Next depo due 5/20-6/3. .. Administrations This Visit    medroxyPROGESTERone (DEPO-PROVERA) injection 150 mg    Admin Date 10/17/2018 Action Given Dose 150 mg Route Intramuscular Administered By Katrina Stack, RN

## 2018-10-17 NOTE — Progress Notes (Signed)
Agree with A & P. 

## 2019-01-04 ENCOUNTER — Ambulatory Visit (INDEPENDENT_AMBULATORY_CARE_PROVIDER_SITE_OTHER): Payer: Medicaid Other

## 2019-01-04 ENCOUNTER — Other Ambulatory Visit: Payer: Self-pay

## 2019-01-04 DIAGNOSIS — Z3042 Encounter for surveillance of injectable contraceptive: Secondary | ICD-10-CM

## 2019-01-04 NOTE — Progress Notes (Signed)
Pt is in the office for depo, administered in RUOQ and pt tolerated well. Next due 8/7-8/21 .Marland Kitchen Administrations This Visit    medroxyPROGESTERone (DEPO-PROVERA) injection 150 mg    Admin Date 01/04/2019 Action Given Dose 150 mg Route Intramuscular Administered By Katrina Stack, RN

## 2019-01-04 NOTE — Progress Notes (Signed)
Patient ID: Loretta Brewer, female   DOB: October 23, 1985, 33 y.o.   MRN: [id number] I have reviewed the chart and agree with nursing staff's documentation of this patient's encounter.  Scheryl Darter, MD 01/04/2019 12:03 PM

## 2019-01-14 ENCOUNTER — Ambulatory Visit: Payer: Medicaid Other

## 2019-01-19 ENCOUNTER — Emergency Department (HOSPITAL_COMMUNITY)
Admission: EM | Admit: 2019-01-19 | Discharge: 2019-01-19 | Disposition: A | Discharge status: Home / Self Care | Payer: Medicaid Other | Attending: Emergency Medicine | Admitting: Emergency Medicine

## 2019-01-19 ENCOUNTER — Encounter (HOSPITAL_COMMUNITY): Payer: Self-pay

## 2019-01-19 ENCOUNTER — Other Ambulatory Visit: Payer: Self-pay

## 2019-01-19 DIAGNOSIS — Z87891 Personal history of nicotine dependence: Secondary | ICD-10-CM | POA: Insufficient documentation

## 2019-01-19 DIAGNOSIS — F419 Anxiety disorder, unspecified: Secondary | ICD-10-CM | POA: Insufficient documentation

## 2019-01-19 DIAGNOSIS — Z79899 Other long term (current) drug therapy: Secondary | ICD-10-CM | POA: Insufficient documentation

## 2019-01-19 MED ORDER — ALPRAZOLAM 0.25 MG PO TABS: 0.2500 mg | ORAL_TABLET | Freq: Three times a day (TID) | ORAL | 0 refills | Status: DC | PRN

## 2019-01-19 NOTE — ED Provider Notes (Signed)
[facility] DEPT Provider Note   CSN: [id number] Arrival date & time: 01/19/19  1103    History   Chief Complaint Chief Complaint  Patient presents with  . Anxiety    HPI Loretta Brewer is a 33 y.o. female.     This is a 33 year old female who presents after having episode of panic.  Patient states that she awoke and had racing thoughts and began to hyperventilate.  She experienced spasms in her hands as well as her feet.  Was also short of breath and some chest tightness.  Symptoms became better after she was able to calm down.  States she has a lot of stress in her life at this time.  Denies any recent use of alcohol or drugs.  Does not take any chronic medications for anxiety.     Past Medical History:  Diagnosis Date  . Anemia   . Sleep apnea     Patient Active Problem List   Diagnosis Date Noted  . Late prenatal care 06/30/2018  . Labor and delivery, indication for care 06/29/2018  . Supervision of normal pregnancy 05/07/2018    Past Surgical History:  Procedure Laterality Date  . DENTAL SURGERY       OB History    Gravida  5   Para  3   Term  3   Preterm      AB  2   Living  3     SAB      TAB  2   Ectopic      Multiple  0   Live Births  3            Home Medications    Prior to Admission medications   Medication Sig Start Date End Date Taking? Authorizing Provider  ferrous sulfate 325 (65 FE) MG tablet Take 1 tablet (325 mg total) by mouth 2 (two) times daily with a meal. 05/10/18   Shelly Bombard, MD  ibuprofen (ADVIL,MOTRIN) 600 MG tablet Take 1 tablet (600 mg total) by mouth every 6 (six) hours as needed. 06/30/18   Myrtis Ser, CNM  medroxyPROGESTERone (DEPO-PROVERA) 150 MG/ML injection Inject 1 mL (150 mg total) into the muscle every 3 (three) months. 07/26/18   Shelly Bombard, MD  prenatal vitamin w/FE, FA (PRENATAL 1 + 1) 27-1 MG TABS tablet Take 1 tablet by mouth daily before  breakfast. 05/07/18   Shelly Bombard, MD    Family History Family History  Problem Relation Age of Onset  . Hypertension Father     Social History Social History   Tobacco Use  . Smoking status: Former Smoker    Packs/day: 0.25    Types: Cigarettes  . Smokeless tobacco: Never Used  Substance Use Topics  . Alcohol use: Not Currently    Comment: early in pregnacy not anymore "been a couple months"  . Drug use: Not Currently    Types: Marijuana    Comment: late July / early Aug      Allergies   Patient has no known allergies.   Review of Systems Review of Systems  All other systems reviewed and are negative.    Physical Exam Updated Vital Signs BP 134/81 (BP Location: Left Arm)   Pulse (!) 105   Temp 97.9 F (36.6 C) (Oral)   Resp 18   Ht 1.676 m (5\' 6" )   Wt 63.5 kg   SpO2 100%   BMI 22.60 kg/m   Physical Exam  Vitals signs and nursing note reviewed.  Constitutional:      General: She is not in acute distress.    Appearance: Normal appearance. She is well-developed. She is not toxic-appearing.  HENT:     Head: Normocephalic and atraumatic.  Eyes:     General: Lids are normal.     Conjunctiva/sclera: Conjunctivae normal.     Pupils: Pupils are equal, round, and reactive to light.  Neck:     Musculoskeletal: Normal range of motion and neck supple.     Thyroid: No thyroid mass.     Trachea: No tracheal deviation.  Cardiovascular:     Rate and Rhythm: Normal rate and regular rhythm.     Heart sounds: Normal heart sounds. No murmur. No gallop.   Pulmonary:     Effort: Pulmonary effort is normal. No respiratory distress.     Breath sounds: Normal breath sounds. No stridor. No decreased breath sounds, wheezing, rhonchi or rales.  Abdominal:     General: Bowel sounds are normal. There is no distension.     Palpations: Abdomen is soft.     Tenderness: There is no abdominal tenderness. There is no rebound.  Musculoskeletal: Normal range of motion.         General: No tenderness.  Skin:    General: Skin is warm and dry.     Findings: No abrasion or rash.  Neurological:     Mental Status: She is alert and oriented to person, place, and time.     GCS: GCS eye subscore is 4. GCS verbal subscore is 5. GCS motor subscore is 6.     Cranial Nerves: No cranial nerve deficit.     Sensory: No sensory deficit.  Psychiatric:        Attention and Perception: She is inattentive.        Speech: Speech normal.        Behavior: Behavior normal.      ED Treatments / Results  Labs (all labs ordered are listed, but only abnormal results are displayed) Labs Reviewed - No data to display  EKG None  Radiology No results found.  Procedures Procedures (including critical care time)  Medications Ordered in ED Medications - No data to display   Initial Impression / Assessment and Plan / ED Course  I have reviewed the triage vital signs and the nursing notes.  Pertinent labs & imaging results that were available during my care of the patient were reviewed by me and considered in my medical decision making (see chart for details).        Patient appears to have suffered what was likely an anxiety attack which caused carpopedal spasm.  States that she feels better now that she was awake in her situation.  Will prescribe a short course of Xanax and return precautions given.  Final Clinical Impressions(s) / ED Diagnoses   Final diagnoses:  None    ED Discharge Orders    None       Lorre NickAllen, Laketha Leopard, MD 01/19/19 1333

## 2019-01-19 NOTE — ED Triage Notes (Signed)
Pt BIBA from home. Pt has tightness in hands. Pt may have had a panic attack. Pt had tingling in arms and legs. Pt was ambulatory at home. Pt has since slowed breathing down.  Pt states that it started when she was on the phone.

## 2019-03-28 ENCOUNTER — Ambulatory Visit (INDEPENDENT_AMBULATORY_CARE_PROVIDER_SITE_OTHER): Payer: Medicaid Other

## 2019-03-28 ENCOUNTER — Other Ambulatory Visit: Payer: Self-pay

## 2019-03-28 DIAGNOSIS — Z3042 Encounter for surveillance of injectable contraceptive: Secondary | ICD-10-CM

## 2019-03-28 MED ORDER — MEDROXYPROGESTERONE ACETATE 150 MG/ML IM SUSP: 150.0000 mg | INTRAMUSCULAR | 0 refills | Status: DC

## 2019-03-28 NOTE — Progress Notes (Signed)
Pt is in the office for depo injection, administered in [place] and pt tolerated well. Next depo due 10/29-11/12. Marland Kitchen.Advised to schedule annual Administrations This Visit    medroxyPROGESTERone (DEPO-PROVERA) injection 150 mg    Admin Date 03/28/2019 Action Given Dose 150 mg Route Intramuscular Administered By Hinton Lovely, RN

## 2019-04-18 ENCOUNTER — Other Ambulatory Visit: Payer: Self-pay

## 2019-04-18 ENCOUNTER — Emergency Department (HOSPITAL_COMMUNITY)
Admission: EM | Admit: 2019-04-18 | Discharge: 2019-04-18 | Disposition: A | Discharge status: Home / Self Care | Payer: Medicaid Other | Attending: Emergency Medicine | Admitting: Emergency Medicine

## 2019-04-18 DIAGNOSIS — F10229 Alcohol dependence with intoxication, unspecified: Secondary | ICD-10-CM | POA: Insufficient documentation

## 2019-04-18 DIAGNOSIS — Y908 Blood alcohol level of 240 mg/100 ml or more: Secondary | ICD-10-CM | POA: Insufficient documentation

## 2019-04-18 DIAGNOSIS — F1010 Alcohol abuse, uncomplicated: Secondary | ICD-10-CM

## 2019-04-18 DIAGNOSIS — Z79899 Other long term (current) drug therapy: Secondary | ICD-10-CM | POA: Insufficient documentation

## 2019-04-18 DIAGNOSIS — Z87891 Personal history of nicotine dependence: Secondary | ICD-10-CM | POA: Insufficient documentation

## 2019-04-18 DIAGNOSIS — F329 Major depressive disorder, single episode, unspecified: Secondary | ICD-10-CM | POA: Insufficient documentation

## 2019-04-18 LAB — COMPREHENSIVE METABOLIC PANEL
ALT: 35 U/L (ref 0–44)
AST: 41 U/L (ref 15–41)
Albumin: 4.3 g/dL (ref 3.5–5.0)
Alkaline Phosphatase: 54 U/L (ref 38–126)
Anion gap: 14 (ref 5–15)
BUN: 8 mg/dL (ref 6–20)
CO2: 21 mmol/L — ABNORMAL LOW (ref 22–32)
Calcium: 8.7 mg/dL — ABNORMAL LOW (ref 8.9–10.3)
Chloride: 111 mmol/L (ref 98–111)
Creatinine, Ser: 0.84 mg/dL (ref 0.44–1.00)
GFR calc Af Amer: >60 mL/min (ref >60)
GFR calc non Af Amer: >60 mL/min (ref >60)
Glucose, Bld: 115 mg/dL — ABNORMAL HIGH (ref 70–99)
Potassium: 3.4 mmol/L — ABNORMAL LOW (ref 3.5–5.1)
Sodium: 146 mmol/L — ABNORMAL HIGH (ref 135–145)
Total Bilirubin: 0.5 mg/dL (ref 0.3–1.2)
Total Protein: 7.6 g/dL (ref 6.5–8.1)

## 2019-04-18 LAB — CBC WITH DIFFERENTIAL/PLATELET
Abs Immature Granulocytes: 0.02 10*3/uL (ref 0.00–0.07)
Basophils Absolute: 0 10*3/uL (ref 0.0–0.1)
Basophils Relative: 0 %
Eosinophils Absolute: 0.2 10*3/uL (ref 0.0–0.5)
Eosinophils Relative: 2 %
HCT: 37.7 % (ref 36.0–46.0)
Hemoglobin: 12 g/dL (ref 12.0–15.0)
Immature Granulocytes: 0 %
Lymphocytes Relative: 38 %
Lymphs Abs: 2.9 10*3/uL (ref 0.7–4.0)
MCH: 28.1 pg (ref 26.0–34.0)
MCHC: 31.8 g/dL (ref 30.0–36.0)
MCV: 88.3 fL (ref 80.0–100.0)
Monocytes Absolute: 0.4 10*3/uL (ref 0.1–1.0)
Monocytes Relative: 5 %
Neutro Abs: 4.3 10*3/uL (ref 1.7–7.7)
Neutrophils Relative %: 55 %
Platelets: 225 10*3/uL (ref 150–400)
RBC: 4.27 MIL/uL (ref 3.87–5.11)
RDW: 14.6 % (ref 11.5–15.5)
WBC: 7.8 10*3/uL (ref 4.0–10.5)
nRBC: 0 % (ref 0.0–0.2)

## 2019-04-18 LAB — RAPID URINE DRUG SCREEN, HOSP PERFORMED
Amphetamines: NOT DETECTED
Barbiturates: NOT DETECTED
Benzodiazepines: NOT DETECTED
Cocaine: NOT DETECTED
Opiates: NOT DETECTED
Tetrahydrocannabinol: POSITIVE — AB

## 2019-04-18 LAB — I-STAT BETA HCG BLOOD, ED (MC, WL, AP ONLY): I-stat hCG, quantitative: <5 m[IU]/mL (ref <5)

## 2019-04-18 LAB — ETHANOL: Alcohol, Ethyl (B): 300 mg/dL — ABNORMAL HIGH (ref <10)

## 2019-04-18 LAB — SALICYLATE LEVEL: Salicylate Lvl: <7 mg/dL (ref 2.8–30.0)

## 2019-04-18 LAB — ACETAMINOPHEN LEVEL: Acetaminophen (Tylenol), Serum: <10 ug/mL — ABNORMAL LOW (ref 10–30)
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
## 2019-04-18 NOTE — ED Triage Notes (Signed)
Pt IVC d by family member. Pt has been drinking. Pt stated hates her life.  Pt tearful. Cooperative.

## 2019-06-19 ENCOUNTER — Other Ambulatory Visit: Payer: Self-pay

## 2019-06-19 ENCOUNTER — Ambulatory Visit (INDEPENDENT_AMBULATORY_CARE_PROVIDER_SITE_OTHER): Payer: Medicaid Other | Admitting: *Deleted

## 2019-06-19 DIAGNOSIS — Z3042 Encounter for surveillance of injectable contraceptive: Secondary | ICD-10-CM

## 2019-06-19 NOTE — Progress Notes (Signed)
Pt is in office for Depo injection.  Injection given, pt tolerated well.  Pt advised to RTO 1/20-09/18/2019 for next Depo.  Pt made aware she will need AEX prior to next injection.     Administrations This Visit    medroxyPROGESTERone (DEPO-PROVERA) injection 150 mg    Admin Date 06/19/2019 Action Given Dose 150 mg Route Intramuscular Administered By Valene Bors, CMA

## 2019-06-20 NOTE — Progress Notes (Signed)
I have reviewed this chart and agree with the RN/CMA assessment and management.    K. Meryl Davis, M.D. Attending [facility] (Faculty Practice)   

## 2019-07-12 IMAGING — US US MFM OB COMP +14 WKS
1 series · 14 of 28 positions shown · non-contrast
Comparison: none

[Series 1: us mfm ob comp +14 wks · 14 of 55 slices shown]
[im 3/55]
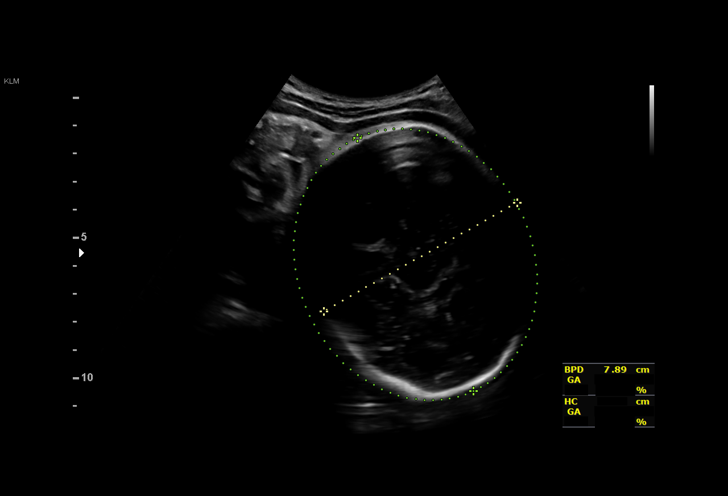
[im 7/55]
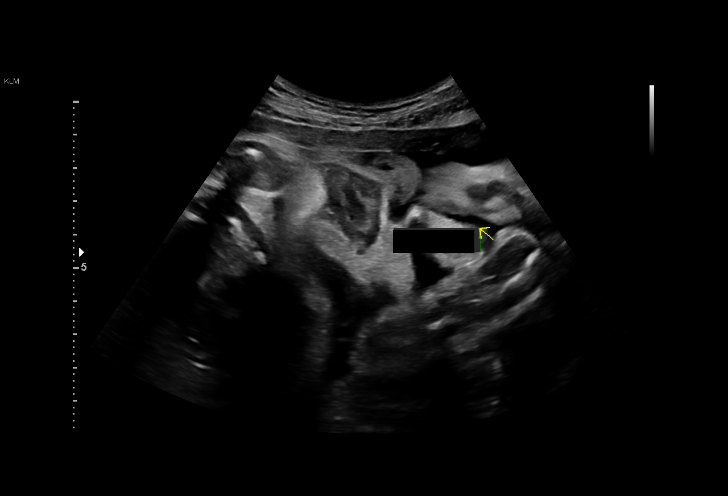
[im 11/55]
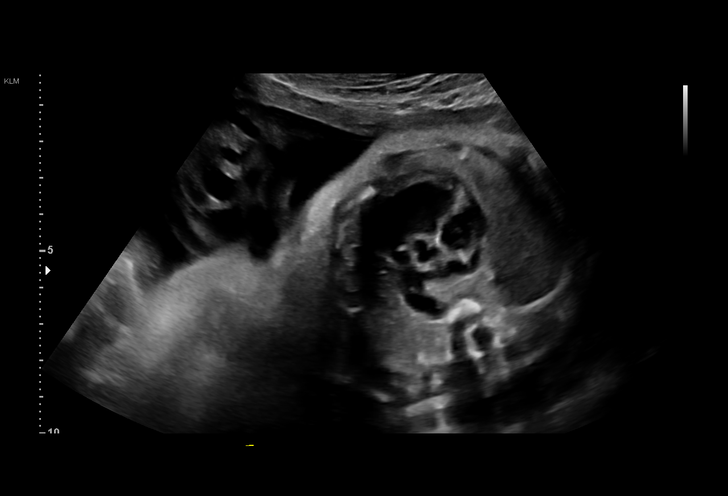
[im 15/55]
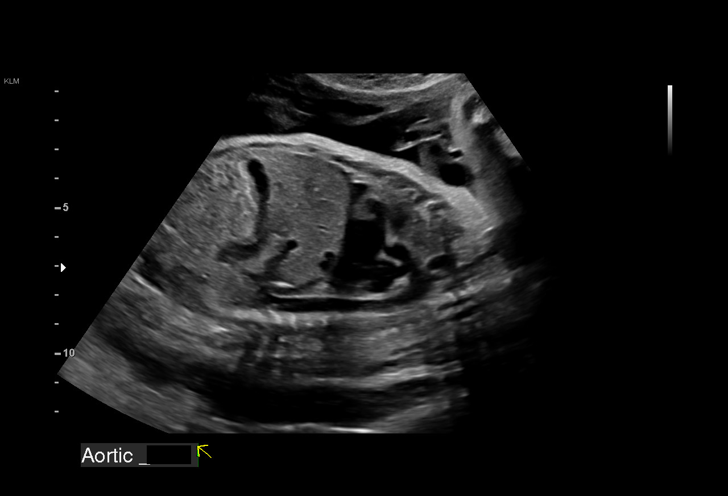
[im 19/55]
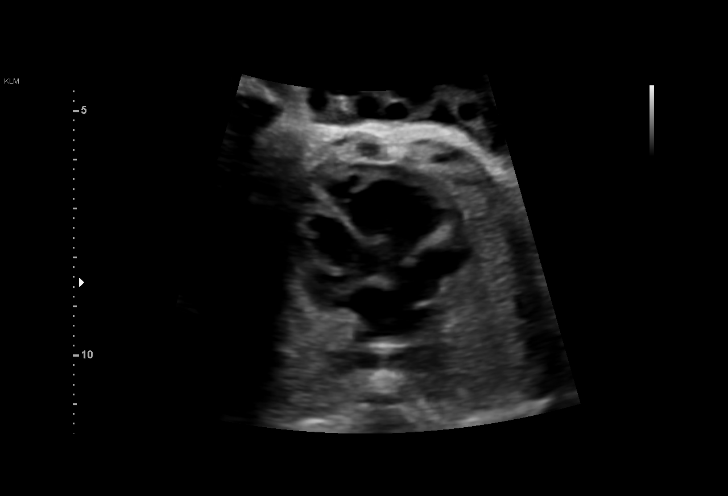
[im 23/55]
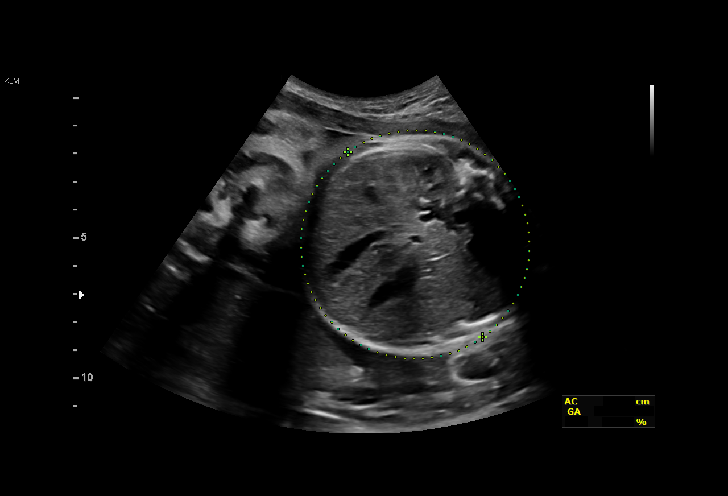
[im 27/55]
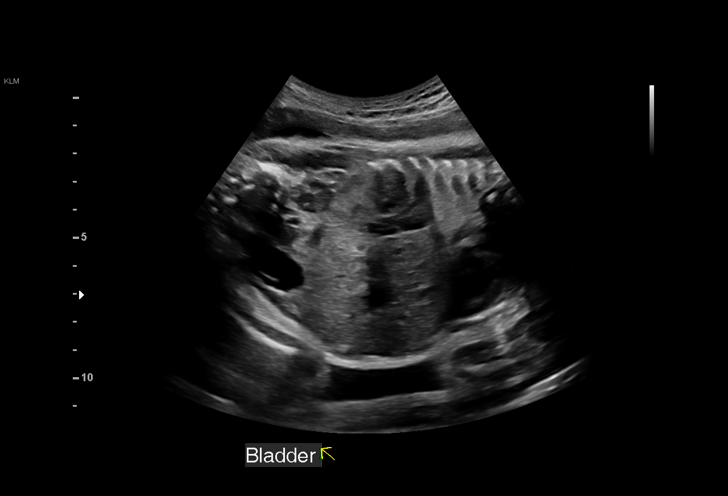
[im 31/55]
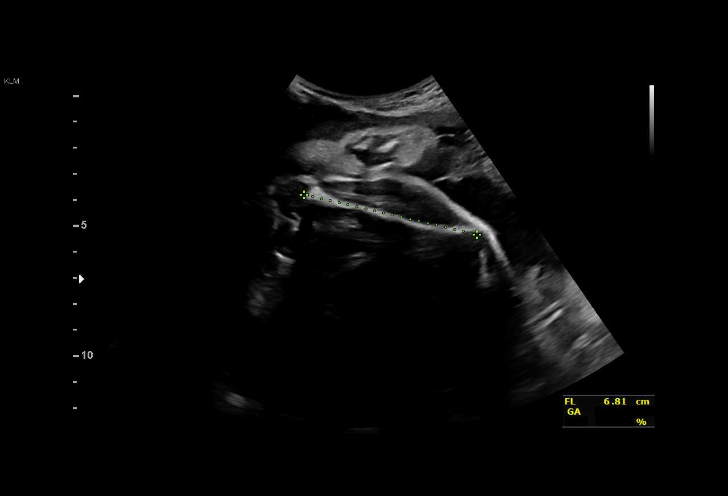
[im 35/55]
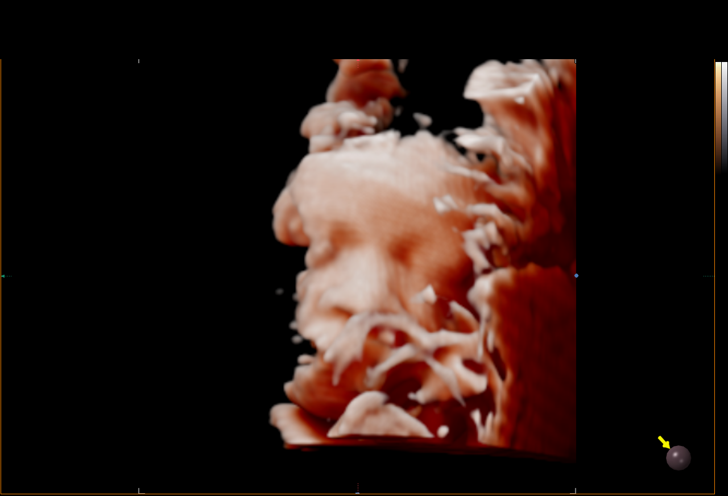
[im 39/55]
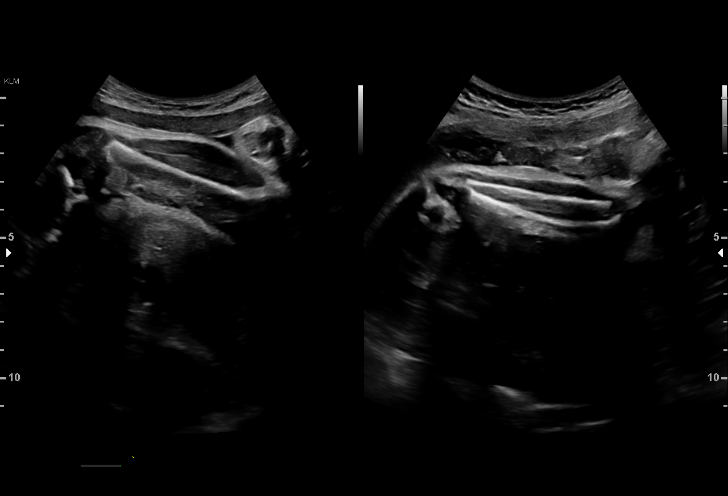
[im 43/55]
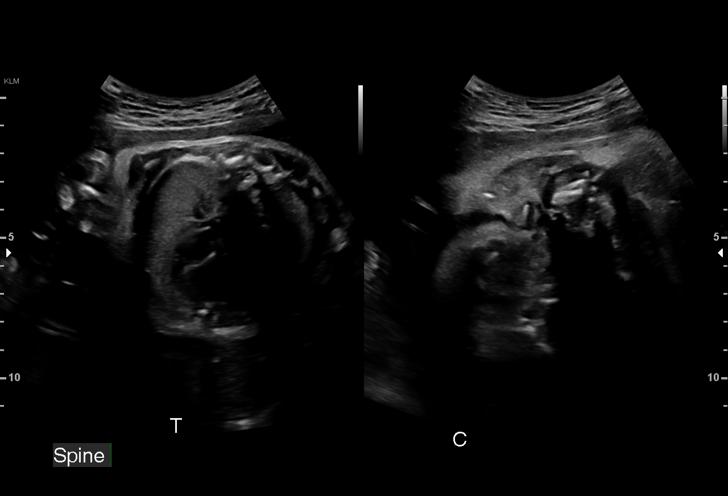
[im 47/55]
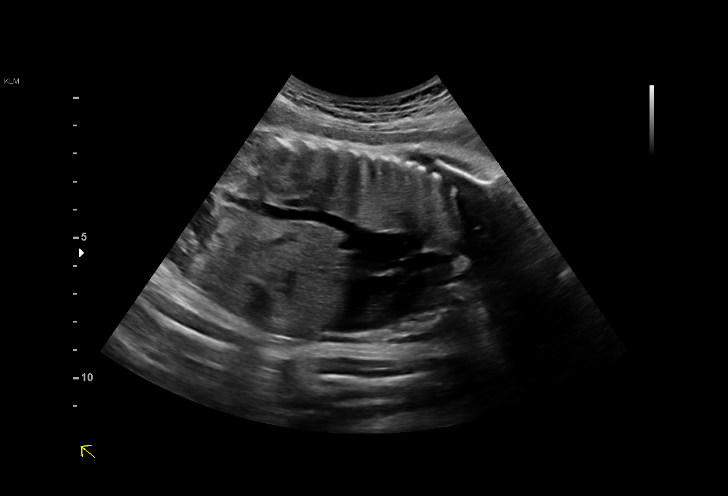
[im 51/55]
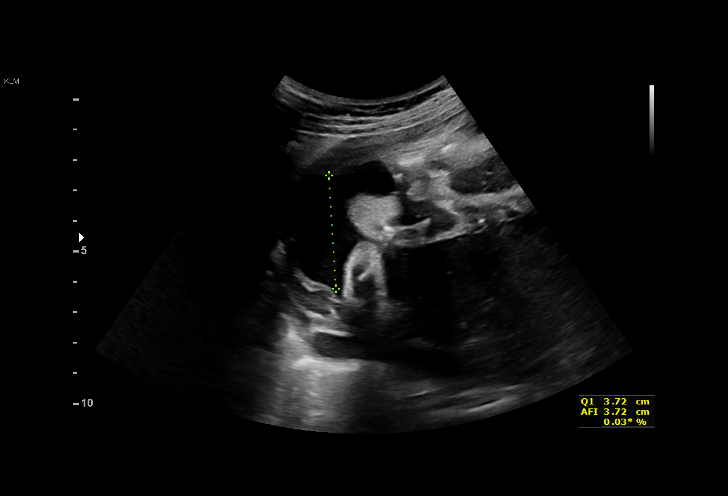
[im 55/55]
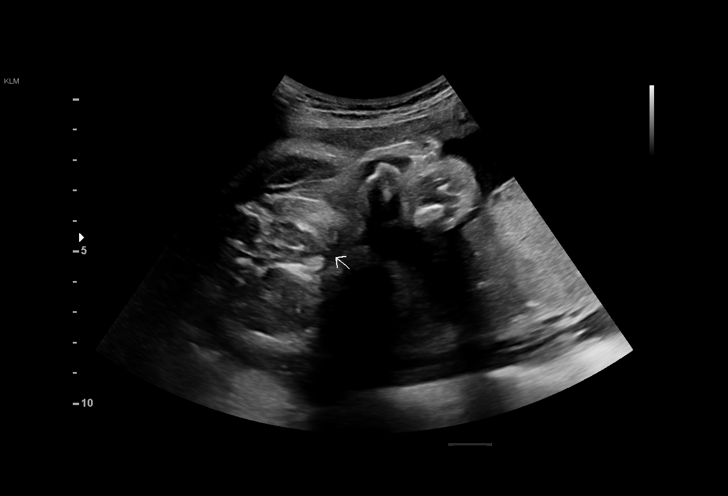

[14 of 28 positions shown; findings below may reference images not displayed]

1  US MFM OB COMP + 14 WK               76805.01     ASHLEE DEMARCO

Indications

Encounter for antenatal screening for
malformations
32 weeks gestation of pregnancy
Late to prenatal care, third trimester
Encounter for uncertain dates
Tobacco use complicating pregnancy, third
trimester
Fetal Evaluation

Num Of Fetuses:         1
Fetal Heart Rate(bpm):  142
Cardiac Activity:       Observed
Presentation:           Cephalic
Placenta:               Fundal
P. Cord Insertion:      Marginal insertion

Amniotic Fluid
AFI FV:      Subjectively low-normal

AFI Sum(cm)     %Tile       Largest Pocket(cm)
8.82            7

RUQ(cm)       RLQ(cm)       LUQ(cm)        LLQ(cm)
3.72
Biometry

BPD:      81.7  mm     G. Age:  32w 6d         63  %    CI:        78.15   %    70 - 86
FL/HC:      22.3   %    19.1 -
HC:      292.4  mm     G. Age:  32w 2d         18  %    HC/AC:      1.13        0.96 -
AC:      257.8  mm     G. Age:  30w 0d          4  %    FL/BPD:     79.8   %    71 - 87
FL:       65.2  mm     G. Age:  33w 4d         77  %    FL/AC:      25.3   %    20 - 24
HUM:      52.7  mm     G. Age:  30w 5d         23  %

Est. FW:    0842  gm    3 lb 15 oz      46  %
OB History

Gravidity:    5         Term:   2        Prem:   0        SAB:   0
TOP:          2       Ectopic:  0        Living: 2
Gestational Age

LMP:           27w 5d        Date:  11/04/17                 EDD:   08/11/18
U/S Today:     32w 1d                                        EDD:   07/11/18
Best:          32w 1d     Det. By:  U/S (05/17/18)           EDD:   07/11/18
Anatomy

Cranium:               Appears normal         Aortic Arch:            Appears normal
Cavum:                 Appears normal         Ductal Arch:            Appears normal
Ventricles:            Appears normal         Diaphragm:              Appears normal
Choroid Plexus:        Appears normal         Stomach:                Appears normal, left
sided
Cerebellum:            Not well visualized    Abdomen:                Appears normal
Posterior Fossa:       Not well visualized    Abdominal Wall:         Not well visualized
Nuchal Fold:           Not applicable (>20    Cord Vessels:           Appears normal (3
wks GA)                                        vessel cord)
Face:                  Appears normal         Kidneys:                Appear normal
(orbits and profile)
Lips:                  Appears normal         Bladder:                Appears normal
Thoracic:              Appears normal         Spine:                  Appears normal
Heart:                 Appears normal         Upper Extremities:      Visualized
(4CH, axis, and situs
RVOT:                  Appears normal         Lower Extremities:      Visualized
LVOT:                  Appears normal

Other:  Technically difficult due to advanced gestational age.
Cervix Uterus Adnexa

Cervix
Not visualized (advanced GA >07wks)
Comments

U/S images reviewed. Appropriate fetal growth is noted but
AC is lagging @ 4% based on an EDC determined by today's
U/S.  No fetal abnormalities are seen
Recommendations: 1) Follow-up growth and completion of
anatomy in 3 weeks
Recommendations

1) Follow-up growth and completion of anatomy in 3 weeks

## 2019-07-16 ENCOUNTER — Ambulatory Visit (INDEPENDENT_AMBULATORY_CARE_PROVIDER_SITE_OTHER): Payer: Medicaid Other | Admitting: Obstetrics

## 2019-07-16 ENCOUNTER — Other Ambulatory Visit: Payer: Self-pay

## 2019-07-16 ENCOUNTER — Encounter: Payer: Self-pay | Admitting: Obstetrics

## 2019-07-16 ENCOUNTER — Other Ambulatory Visit (HOSPITAL_COMMUNITY)
Admission: RE | Admit: 2019-07-16 | Discharge: 2019-07-16 | Disposition: A | Discharge status: Home / Self Care | Payer: Medicaid Other | Source: Ambulatory Visit | Attending: Obstetrics | Admitting: Obstetrics

## 2019-07-16 VITALS — BP 121/82 | HR 99 | Ht 66.0 in | Wt 145.0 lb

## 2019-07-16 DIAGNOSIS — Z124 Encounter for screening for malignant neoplasm of cervix: Secondary | ICD-10-CM | POA: Diagnosis present

## 2019-07-16 DIAGNOSIS — Z0000 Encounter for general adult medical examination without abnormal findings: Secondary | ICD-10-CM | POA: Diagnosis not present

## 2019-07-16 DIAGNOSIS — Z113 Encounter for screening for infections with a predominantly sexual mode of transmission: Secondary | ICD-10-CM

## 2019-07-16 DIAGNOSIS — M791 Myalgia, unspecified site: Secondary | ICD-10-CM

## 2019-07-16 DIAGNOSIS — N898 Other specified noninflammatory disorders of vagina: Secondary | ICD-10-CM | POA: Diagnosis present

## 2019-07-16 DIAGNOSIS — Z01419 Encounter for gynecological examination (general) (routine) without abnormal findings: Secondary | ICD-10-CM

## 2019-07-16 DIAGNOSIS — Z3042 Encounter for surveillance of injectable contraceptive: Secondary | ICD-10-CM

## 2019-07-16 MED ORDER — PRENATE MINI 29-0.6-0.4-350 MG PO CAPS: 1.0000 | ORAL_CAPSULE | Freq: Every day | ORAL | 11 refills | Status: DC

## 2019-07-16 MED ORDER — PRENATAL PLUS 27-1 MG PO TABS: 1.0000 | ORAL_TABLET | Freq: Every day | ORAL | 11 refills | Status: DC

## 2019-07-16 MED ORDER — IBUPROFEN 800 MG PO TABS: 800.0000 mg | ORAL_TABLET | Freq: Three times a day (TID) | ORAL | 5 refills | Status: DC | PRN

## 2019-07-16 MED ORDER — MEDROXYPROGESTERONE ACETATE 150 MG/ML IM SUSP: 150.0000 mg | INTRAMUSCULAR | 3 refills | Status: DC

## 2019-07-16 MED ORDER — PRENATAL 27-0.8 MG PO TABS: 1.0000 | ORAL_TABLET | Freq: Every day | ORAL | 11 refills | Status: DC

## 2019-07-16 NOTE — Addendum Note (Signed)
Addended by: Baltazar Najjar A on: 07/16/2019 10:39 AM   Modules accepted: Orders

## 2019-07-16 NOTE — Progress Notes (Addendum)
Subjective:        Loretta Brewer is a 33 y.o. female here for a routine exam.  Current complaints: None.    Personal health questionnaire:  Is patient Loretta Brewer, have a family history of breast and/or ovarian cancer: no Is there a family history of uterine cancer diagnosed at age < 150, gastrointestinal cancer, urinary tract cancer, family member who is a Personnel officerLynch syndrome-associated carrier: no Is the patient overweight and hypertensive, family history of diabetes, personal history of gestational diabetes, preeclampsia or PCOS: no Is patient over 1355, have PCOS,  family history of premature CHD under age 34, diabetes, smoke, have hypertension or peripheral artery disease:  no At any time, has a partner hit, kicked or otherwise hurt or frightened you?: no Over the past 2 weeks, have you felt down, depressed or hopeless?: no Over the past 2 weeks, have you felt little interest or pleasure in doing things?:no   Gynecologic History No LMP recorded. Patient has had an injection. Contraception: Depo-Provera injections Last Pap: 05-07-2018. Results were: normal Last mammogram: n/a. Results were: n/a  Obstetric History OB History  Gravida Para Term Preterm AB Living  5 3 3   2 3   SAB TAB Ectopic Multiple Live Births    2   0 3    # Outcome Date GA Lbr Len/2nd Weight Sex Delivery Anes PTL Lv  5 Term 06/29/18 [id number] 00:42 / 00:18 5 lb 15.4 oz (2.705 kg) F Vag-Spont EPI  LIV  4 Term           3 Term           2 TAB           1 TAB             Past Medical History:  Diagnosis Date  . Anemia   . Sleep apnea     Past Surgical History:  Procedure Laterality Date  . DENTAL SURGERY       Current Outpatient Medications:  .  medroxyPROGESTERone (DEPO-PROVERA) 150 MG/ML injection, Inject 1 mL (150 mg total) into the muscle every 3 (three) months., Disp: 1 mL, Rfl: 3 .  ALPRAZolam (XANAX) 0.25 MG tablet, Take 1 tablet (0.25 mg total) by mouth 3 (three) times daily as needed for  anxiety. (Patient not taking: Reported on 03/28/2019), Disp: 10 tablet, Rfl: 0 .  ferrous sulfate 325 (65 FE) MG tablet, Take 1 tablet (325 mg total) by mouth 2 (two) times daily with a meal. (Patient not taking: Reported on 03/28/2019), Disp: 60 tablet, Rfl: 5 .  ibuprofen (ADVIL) 800 MG tablet, Take 1 tablet (800 mg total) by mouth every 8 (eight) hours as needed., Disp: 30 tablet, Rfl: 5 .  ibuprofen (ADVIL,MOTRIN) 600 MG tablet, Take 1 tablet (600 mg total) by mouth every 6 (six) hours as needed. (Patient not taking: Reported on 03/28/2019), Disp: 30 tablet, Rfl: 0 .  Prenatal Vit-Fe Fumarate-FA (MULTIVITAMIN-PRENATAL) 27-0.8 MG TABS tablet, Take 1 tablet by mouth daily at 12 noon., Disp: 30 tablet, Rfl: 11 .  prenatal vitamin w/FE, FA (PRENATAL 1 + 1) 27-1 MG TABS tablet, Take 1 tablet by mouth daily before breakfast., Disp: 30 tablet, Rfl: 11 .  thiamine (VITAMIN B-1) 100 MG tablet, Take 100 mg by mouth daily., Disp: , Rfl:   Current Facility-Administered Medications:  .  medroxyPROGESTERone (DEPO-PROVERA) injection 150 mg, 150 mg, Intramuscular, Once, Brock BadHarper, Even Budlong A, MD .  medroxyPROGESTERone (DEPO-PROVERA) injection 150 mg, 150 mg, Intramuscular, Q90 days,  Brock Bad, MD, 150 mg at 06/19/19 2671 No Known Allergies  Social History   Tobacco Use  . Smoking status: Former Smoker    Packs/day: 0.25    Types: Cigarettes  . Smokeless tobacco: Never Used  Substance Use Topics  . Alcohol use: Not Currently    Comment: early in pregnacy not anymore "been a couple months"    Family History  Problem Relation Age of Onset  . Hypertension Father       Review of Systems  Constitutional: negative for fatigue and weight loss Respiratory: negative for cough and wheezing Cardiovascular: negative for chest pain, fatigue and palpitations Gastrointestinal: negative for abdominal pain and change in bowel habits Musculoskeletal:negative for myalgias Neurological: negative for gait  problems and tremors Behavioral/Psych: negative for abusive relationship, depression Endocrine: negative for temperature intolerance    Genitourinary:negative for abnormal menstrual periods, genital lesions, hot flashes, sexual problems and vaginal discharge Integument/breast: negative for breast lump, breast tenderness, nipple discharge and skin lesion(s)    Objective:       BP 121/82   Pulse 99   Ht 5\' 6"  (1.676 m)   Wt 145 lb (65.8 kg)   BMI 23.40 kg/m  General:   alert  Skin:   no rash or abnormalities  Lungs:   clear to auscultation bilaterally  Heart:   regular rate and rhythm, S1, S2 normal, no murmur, click, rub or gallop  Breasts:   normal without suspicious masses, skin or nipple changes or axillary nodes  Abdomen:  normal findings: no organomegaly, soft, non-tender and no hernia  Pelvis:  External genitalia: normal general appearance Urinary system: urethral meatus normal and bladder without fullness, nontender Vaginal: normal without tenderness, induration or masses Cervix: normal appearance Adnexa: normal bimanual exam Uterus: anteverted and non-tender, normal size   Lab Review Urine pregnancy test Labs reviewed yes Radiologic studies reviewed no  50% of 25 min visit spent on counseling and coordination of care.   Assessment:     1. Encounter for routine gynecological examination with Papanicolaou smear of cervix Rx: - Cytology - PAP( [place])  2. Myalgia, unspecified site Rx: - ibuprofen (ADVIL) 800 MG tablet; Take 1 tablet (800 mg total) by mouth every 8 (eight) hours as needed.  Dispense: 30 tablet; Refill: 5  3. Vaginal discharge Rx: - Cervicovaginal ancillary only( )  4. Screening for STD (sexually transmitted disease) Rx: - Hepatitis B surface antigen - Hepatitis C antibody - HIV antibody - RPR  5. Encounter for surveillance of injectable contraceptive Rx: - medroxyPROGESTERone (DEPO-PROVERA) 150 MG/ML injection; Inject 1 mL  (150 mg total) into the muscle every 3 (three) months.  Dispense: 1 mL; Refill: 3  6. Routine adult health maintenance Rx: - prenatal vitamin w/FE, FA (PRENATAL 1 + 1) 27-1 MG TABS tablet; Take 1 tablet by mouth daily before breakfast.  Dispense: 30 tablet; Refill: 11    Plan:    Education reviewed: calcium supplements, depression evaluation, low fat, low cholesterol diet, safe sex/STD prevention, self breast exams and weight bearing exercise. Contraception: Depo-Provera injections. Follow up in: 1 year.   Meds ordered this encounter  Medications  . medroxyPROGESTERone (DEPO-PROVERA) 150 MG/ML injection    Sig: Inject 1 mL (150 mg total) into the muscle every 3 (three) months.    Dispense:  1 mL    Refill:  3  . DISCONTD: Prenat w/o A-FeCbn-Meth-FA-DHA (PRENATE MINI) 29-0.6-0.4-350 MG CAPS    Sig: Take 1 capsule by mouth daily before breakfast.  Dispense:  30 capsule    Refill:  11  . ibuprofen (ADVIL) 800 MG tablet    Sig: Take 1 tablet (800 mg total) by mouth every 8 (eight) hours as needed.    Dispense:  30 tablet    Refill:  5  . Prenatal Vit-Fe Fumarate-FA (MULTIVITAMIN-PRENATAL) 27-0.8 MG TABS tablet    Sig: Take 1 tablet by mouth daily at 12 noon.    Dispense:  30 tablet    Refill:  11  . prenatal vitamin w/FE, FA (PRENATAL 1 + 1) 27-1 MG TABS tablet    Sig: Take 1 tablet by mouth daily before breakfast.    Dispense:  30 tablet    Refill:  11   Orders Placed This Encounter  Procedures  . Hepatitis B surface antigen  . Hepatitis C antibody  . HIV antibody  . RPR    Shelly Bombard, MD 07/16/2019 10:32 AM

## 2019-07-16 NOTE — Progress Notes (Signed)
Patient presents for Annual Exam Today.  Last pap: 05/07/2018 WNL  LMP: Contraception: Depo Needs refill.  STD Screening: Desires Full Panel  Family Hx of Breast Cancer : MGM and PGM   Pt would like refill on PNV's .   CC: none

## 2019-07-17 LAB — HEPATITIS B SURFACE ANTIGEN: Hepatitis B Surface Ag: NEGATIVE

## 2019-07-17 LAB — RPR: RPR Ser Ql: NONREACTIVE

## 2019-07-17 LAB — HEPATITIS C ANTIBODY: Hep C Virus Ab: <0.1 s/co ratio (ref 0.0–0.9)

## 2019-07-17 LAB — CERVICOVAGINAL ANCILLARY ONLY
Bacterial Vaginitis (gardnerella): POSITIVE — AB
Candida Glabrata: NEGATIVE
Candida Vaginitis: NEGATIVE
Chlamydia: NEGATIVE
Comment: NEGATIVE
Comment: NEGATIVE
Comment: NEGATIVE
Comment: NEGATIVE
Comment: NEGATIVE
Comment: NORMAL
Neisseria Gonorrhea: NEGATIVE
Trichomonas: NEGATIVE

## 2019-07-17 LAB — HIV ANTIBODY (ROUTINE TESTING W REFLEX): HIV Screen 4th Generation wRfx: NONREACTIVE

## 2019-07-17 LAB — CYTOLOGY - PAP
Comment: NEGATIVE
Diagnosis: NEGATIVE
High risk HPV: NEGATIVE

## 2019-07-18 ENCOUNTER — Other Ambulatory Visit: Payer: Self-pay | Admitting: Obstetrics

## 2019-07-18 DIAGNOSIS — N760 Acute vaginitis: Secondary | ICD-10-CM

## 2019-07-18 MED ORDER — TINIDAZOLE 500 MG PO TABS: 1000.0000 mg | ORAL_TABLET | Freq: Every day | ORAL | 2 refills | Status: DC

## 2019-07-23 NOTE — Progress Notes (Signed)
Patient ID: Loretta Brewer, female   DOB: [birth year], 33 y.o.   MRN: [id number] Patient seen and assessed by nursing staff during this encounter. I have reviewed the chart and agree with the documentation and plan.  Emeterio Reeve, MD 07/23/2019 3:45 PM

## 2019-09-11 ENCOUNTER — Ambulatory Visit (INDEPENDENT_AMBULATORY_CARE_PROVIDER_SITE_OTHER): Payer: Medicaid Other

## 2019-09-11 ENCOUNTER — Other Ambulatory Visit: Payer: Self-pay

## 2019-09-11 DIAGNOSIS — Z3042 Encounter for surveillance of injectable contraceptive: Secondary | ICD-10-CM

## 2019-09-11 NOTE — Progress Notes (Signed)
I have reviewed this chart and agree with the RN/CMA assessment and management.    K. Meryl Ja Pistole, M.D. Attending [facility] (Faculty Practice)   

## 2019-09-11 NOTE — Progress Notes (Signed)
Pt is in the office for depo injection, administered in LUOQ and pt tolerated well. Next due April 14- April 28. .. Administrations This Visit    medroxyPROGESTERone (DEPO-PROVERA) injection 150 mg    Admin Date 09/11/2019 Action Given Dose 150 mg Route Intramuscular Administered By Katrina Stack, RN

## 2019-12-04 ENCOUNTER — Ambulatory Visit: Payer: Medicaid Other

## 2020-02-03 ENCOUNTER — Other Ambulatory Visit: Payer: Self-pay

## 2020-02-03 ENCOUNTER — Ambulatory Visit (INDEPENDENT_AMBULATORY_CARE_PROVIDER_SITE_OTHER): Payer: Medicaid Other | Admitting: *Deleted

## 2020-02-03 DIAGNOSIS — Z3049 Encounter for surveillance of other contraceptives: Secondary | ICD-10-CM

## 2020-02-03 DIAGNOSIS — Z3202 Encounter for pregnancy test, result negative: Secondary | ICD-10-CM | POA: Diagnosis not present

## 2020-02-03 LAB — POCT URINE PREGNANCY: Preg Test, Ur: NEGATIVE

## 2020-02-03 NOTE — Progress Notes (Signed)
Pt is in office for UPT for depo restart.  Pt has not had depo injection since Jan 2021.  UPT in office today is Negative. Pt made aware to RTO in 2 weeks for repeat UPT and Depo injection.   Pt has no other concerns.

## 2020-02-04 NOTE — Progress Notes (Signed)
Patient was assessed and managed by nursing staff during this encounter. I have reviewed the chart and agree with the documentation and plan. I have also made any necessary editorial changes.  Catalina Antigua, MD 02/04/2020 1:45 PM

## 2020-02-18 ENCOUNTER — Ambulatory Visit (INDEPENDENT_AMBULATORY_CARE_PROVIDER_SITE_OTHER): Payer: Medicaid Other

## 2020-02-18 ENCOUNTER — Other Ambulatory Visit: Payer: Self-pay

## 2020-02-18 VITALS — BP 125/86 | HR 101 | Wt 137.0 lb

## 2020-02-18 DIAGNOSIS — Z3202 Encounter for pregnancy test, result negative: Secondary | ICD-10-CM | POA: Diagnosis not present

## 2020-02-18 DIAGNOSIS — Z3042 Encounter for surveillance of injectable contraceptive: Secondary | ICD-10-CM | POA: Diagnosis not present

## 2020-02-18 DIAGNOSIS — Z30013 Encounter for initial prescription of injectable contraceptive: Secondary | ICD-10-CM

## 2020-02-18 LAB — POCT URINE PREGNANCY: Preg Test, Ur: NEGATIVE

## 2020-02-18 MED ORDER — MEDROXYPROGESTERONE ACETATE 150 MG/ML IM SUSP
150.0000 mg | Freq: Once | INTRAMUSCULAR | Status: AC
  Administered 2020-02-18: 150 mg via INTRAMUSCULAR

## 2020-02-18 NOTE — Progress Notes (Signed)
SUBJECTIVE GYN presents for 2nd. UPT and DEPO restart. UPT today is NEGATIVE. DEPO given in RUOQ, tolerated well.  Next DEPO Sept. 21- Oct. 5, 2021  Administrations This Visit    medroxyPROGESTERone (DEPO-PROVERA) injection 150 mg    Admin Date 02/18/2020 Action Given Dose 150 mg Route Intramuscular Administered By Maretta Bees, RMA

## 2020-02-18 NOTE — Progress Notes (Signed)
Chart reviewed for nurse visit. Agree with plan of care.   Airrion Otting L, DO 02/18/2020 1:05 PM

## 2020-04-20 ENCOUNTER — Emergency Department (HOSPITAL_COMMUNITY)
Admission: EM | Admit: 2020-04-20 | Discharge: 2020-04-20 | Disposition: A | Discharge status: Home / Self Care | Payer: Medicaid Other | Attending: Emergency Medicine | Admitting: Emergency Medicine

## 2020-04-20 ENCOUNTER — Other Ambulatory Visit: Payer: Self-pay

## 2020-04-20 ENCOUNTER — Encounter (HOSPITAL_COMMUNITY): Payer: Self-pay | Admitting: Emergency Medicine

## 2020-04-20 DIAGNOSIS — Z87891 Personal history of nicotine dependence: Secondary | ICD-10-CM | POA: Insufficient documentation

## 2020-04-20 DIAGNOSIS — J069 Acute upper respiratory infection, unspecified: Secondary | ICD-10-CM

## 2020-04-20 DIAGNOSIS — Z79899 Other long term (current) drug therapy: Secondary | ICD-10-CM | POA: Insufficient documentation

## 2020-04-20 NOTE — Discharge Instructions (Signed)
Use a nasal decongestant if needed for ongoing symptoms of congestion or runny nose.  Use Tylenol for fever or pain. 

## 2020-04-20 NOTE — ED Triage Notes (Signed)
Pt having cough and congestion since Thursday

## 2020-04-20 NOTE — ED Provider Notes (Signed)
[facility]-EMERGENCY DEPT Provider Note   CSN: [id number] Arrival date & time: 04/20/20  1136     History Chief Complaint  Patient presents with  . Cough  . Nasal Congestion    Loretta Brewer is a 34 y.o. female.  HPI She is here for evaluation of rhinorrhea for several days.  Several other family members are also being evaluated for the same problem at this time.  She denies cough, fever, vomiting, dizziness or weakness.  She has not had a Covid vaccine.  There are no other known modifying factors.    Past Medical History:  Diagnosis Date  . Anemia   . Sleep apnea     Patient Active Problem List   Diagnosis Date Noted  . Late prenatal care 06/30/2018  . Labor and delivery, indication for care 06/29/2018  . Supervision of normal pregnancy 05/07/2018    Past Surgical History:  Procedure Laterality Date  . DENTAL SURGERY       OB History    Gravida  5   Para  3   Term  3   Preterm      AB  2   Living  3     SAB      TAB  2   Ectopic      Multiple  0   Live Births  3           Family History  Problem Relation Age of Onset  . Hypertension Father     Social History   Tobacco Use  . Smoking status: Former Smoker    Packs/day: 0.25    Types: Cigarettes  . Smokeless tobacco: Never Used  Vaping Use  . Vaping Use: Never used  Substance Use Topics  . Alcohol use: Not Currently    Comment: early in pregnacy not anymore "been a couple months"  . Drug use: Not Currently    Types: Marijuana    Comment: late July / early Aug     Home Medications Prior to Admission medications   Medication Sig Start Date End Date Taking? Authorizing Provider  ALPRAZolam (XANAX) 0.25 MG tablet Take 1 tablet (0.25 mg total) by mouth 3 (three) times daily as needed for anxiety. Patient not taking: Reported on 03/28/2019 01/19/19   Lorre Nick, MD  ferrous sulfate 325 (65 FE) MG tablet Take 1 tablet (325 mg total) by mouth 2 (two) times  daily with a meal. Patient not taking: Reported on 03/28/2019 05/10/18   Brock Bad, MD  ibuprofen (ADVIL) 800 MG tablet Take 1 tablet (800 mg total) by mouth every 8 (eight) hours as needed. Patient not taking: Reported on 09/11/2019 07/16/19   Brock Bad, MD  ibuprofen (ADVIL,MOTRIN) 600 MG tablet Take 1 tablet (600 mg total) by mouth every 6 (six) hours as needed. Patient not taking: Reported on 03/28/2019 06/30/18   Arabella Merles, CNM  medroxyPROGESTERone (DEPO-PROVERA) 150 MG/ML injection Inject 1 mL (150 mg total) into the muscle every 3 (three) months. Patient not taking: Reported on 09/11/2019 07/16/19   Brock Bad, MD  Prenatal Vit-Fe Fumarate-FA (MULTIVITAMIN-PRENATAL) 27-0.8 MG TABS tablet Take 1 tablet by mouth daily at 12 noon. 07/16/19   Brock Bad, MD  prenatal vitamin w/FE, FA (PRENATAL 1 + 1) 27-1 MG TABS tablet Take 1 tablet by mouth daily before breakfast. Patient not taking: Reported on 09/11/2019 07/16/19   Brock Bad, MD  thiamine (VITAMIN B-1) 100 MG tablet Take 100 mg  by mouth daily.    [provider]  tinidazole (TINDAMAX) 500 MG tablet Take 2 tablets (1,000 mg total) by mouth daily with breakfast. Patient not taking: Reported on 09/11/2019 07/18/19   Brock Bad, MD    Allergies    Patient has no known allergies.  Review of Systems   Review of Systems  All other systems reviewed and are negative.   Physical Exam Updated Vital Signs BP 114/78   Pulse 91   Temp 98.3 F (36.8 C) (Oral)   Resp 16   SpO2 99%   Physical Exam Vitals and nursing note reviewed.  Constitutional:      General: She is not in acute distress.    Appearance: She is well-developed. She is not ill-appearing, toxic-appearing or diaphoretic.  HENT:     Head: Normocephalic and atraumatic.     Right Ear: External ear normal.     Left Ear: External ear normal.  Eyes:     Conjunctiva/sclera: Conjunctivae normal.     Pupils: Pupils are equal,  round, and reactive to light.  Neck:     Trachea: Phonation normal.  Cardiovascular:     Rate and Rhythm: Normal rate.  Pulmonary:     Effort: Pulmonary effort is normal.  Abdominal:     General: There is no distension.  Musculoskeletal:        General: Normal range of motion.     Cervical back: Normal range of motion and neck supple.  Skin:    General: Skin is warm and dry.  Neurological:     Mental Status: She is alert and oriented to person, place, and time.     Cranial Nerves: No cranial nerve deficit.     Sensory: No sensory deficit.     Motor: No abnormal muscle tone.     Coordination: Coordination normal.  Psychiatric:        Mood and Affect: Mood normal.        Behavior: Behavior normal.        Thought Content: Thought content normal.        Judgment: Judgment normal.     ED Results / Procedures / Treatments   Labs (all labs ordered are listed, but only abnormal results are displayed) Labs Reviewed - No data to display  EKG None  Radiology No results found.  Procedures Procedures (including critical care time)  Medications Ordered in ED Medications - No data to display  ED Course  I have reviewed the triage vital signs and the nursing notes.  Pertinent labs & imaging results that were available during my care of the patient were reviewed by me and considered in my medical decision making (see chart for details).    MDM Rules/Calculators/A&P                           Patient Vitals for the past 24 hrs:  BP Temp Temp src Pulse Resp SpO2  04/20/20 1221 114/78 98.3 F (36.8 C) Oral 91 16 99 %    1:29 PM Reevaluation with update and discussion. After initial assessment and treatment, an updated evaluation reveals no change in clinical status, findings discussed with the patient and all questions were answered. Mancel Bale   Medical Decision Making:  This patient is presenting for evaluation of rhinorrhea, which does not require a range of treatment  options, and is not a complaint that involves a high risk of morbidity and mortality. The differential  diagnoses include URI, allergic syndrome, Covid infection. I decided to review old records, and in summary healthy adult, presenting for rhinorrhea, several other family members with the same problem.  No worrisome symptoms.  I did not require additional historical information from anyone.   Critical Interventions-clinical evaluation, discussion with patient  After These Interventions, the Patient was reevaluated and was found stable for discharge.  No red flag warnings, no respiratory distress.  Suspect uncomplicated URI.  CRITICAL CARE-no Performed by: Mancel Bale  Nursing Notes Reviewed/ Care Coordinated Applicable Imaging Reviewed Interpretation of Laboratory Data incorporated into ED treatment  The patient appears reasonably screened and/or stabilized for discharge and I doubt any other medical condition or other [facility] requiring further screening, evaluation, or treatment in the ED at this time prior to discharge.  Plan: Home Medications-routine OTC medications of choice; Home Treatments-rest, fluids; return here if the recommended treatment, does not improve the symptoms; Recommended follow up-PCP, as needed     Final Clinical Impression(s) / ED Diagnoses Final diagnoses:  None    Rx / DC Orders ED Discharge Orders    None       Mancel Bale, MD 04/20/20 1331

## 2020-04-28 ENCOUNTER — Other Ambulatory Visit: Payer: Self-pay

## 2020-04-28 ENCOUNTER — Encounter (HOSPITAL_COMMUNITY): Payer: Self-pay

## 2020-04-28 ENCOUNTER — Emergency Department (HOSPITAL_COMMUNITY)
Admission: EM | Admit: 2020-04-28 | Discharge: 2020-04-28 | Disposition: A | Discharge status: Home / Self Care | Payer: HRSA Program | Attending: Emergency Medicine | Admitting: Emergency Medicine

## 2020-04-28 DIAGNOSIS — Z87891 Personal history of nicotine dependence: Secondary | ICD-10-CM | POA: Diagnosis not present

## 2020-04-28 DIAGNOSIS — Z20822 Contact with and (suspected) exposure to covid-19: Secondary | ICD-10-CM

## 2020-04-28 DIAGNOSIS — Z79899 Other long term (current) drug therapy: Secondary | ICD-10-CM | POA: Diagnosis not present

## 2020-04-28 DIAGNOSIS — R05 Cough: Secondary | ICD-10-CM | POA: Diagnosis present

## 2020-04-28 DIAGNOSIS — U071 COVID-19: Secondary | ICD-10-CM | POA: Diagnosis not present

## 2020-04-28 NOTE — ED Notes (Signed)
An After Visit Summary was printed and given to the patient. Discharge instructions given and no further questions at this time.  Pt leaving with 3 children who were also patients.

## 2020-04-28 NOTE — ED Triage Notes (Signed)
Pt c/o congestion, cough, and running nose starting 9/1 for herself and her 3 children that are here as patients as well.

## 2020-04-28 NOTE — ED Provider Notes (Signed)
[facility]-EMERGENCY DEPT Provider Note   CSN: [id number] Arrival date & time: 04/28/20  2138     History Chief Complaint  Patient presents with  . Covid Exposure    Jackilyn Umphlett is a 34 y.o. female.  Patient presents to the emergency department with a chief complaint of needing Covid testing.  She and her 3 children are all here for the same.  One child recently tested positive for Covid.  All of her children and herself have congestion, rhinorrhea, but she also reports slight cough.  She denies any fever.  She states that she needs to be tested and have a work note provided.  The history is provided by the patient. No language interpreter was used.       Past Medical History:  Diagnosis Date  . Anemia   . Sleep apnea     Patient Active Problem List   Diagnosis Date Noted  . Late prenatal care 06/30/2018  . Labor and delivery, indication for care 06/29/2018  . Supervision of normal pregnancy 05/07/2018    Past Surgical History:  Procedure Laterality Date  . DENTAL SURGERY       OB History    Gravida  5   Para  3   Term  3   Preterm      AB  2   Living  3     SAB      TAB  2   Ectopic      Multiple  0   Live Births  3           Family History  Problem Relation Age of Onset  . Hypertension Father     Social History   Tobacco Use  . Smoking status: Former Smoker    Packs/day: 0.25    Types: Cigarettes  . Smokeless tobacco: Never Used  Vaping Use  . Vaping Use: Never used  Substance Use Topics  . Alcohol use: Not Currently    Comment: early in pregnacy not anymore "been a couple months"  . Drug use: Not Currently    Types: Marijuana    Comment: late July / early Aug     Home Medications Prior to Admission medications   Medication Sig Start Date End Date Taking? Authorizing Provider  ALPRAZolam (XANAX) 0.25 MG tablet Take 1 tablet (0.25 mg total) by mouth 3 (three) times daily as needed for  anxiety. Patient not taking: Reported on 03/28/2019 01/19/19   Lorre Nick, MD  ferrous sulfate 325 (65 FE) MG tablet Take 1 tablet (325 mg total) by mouth 2 (two) times daily with a meal. Patient not taking: Reported on 03/28/2019 05/10/18   Brock Bad, MD  ibuprofen (ADVIL) 800 MG tablet Take 1 tablet (800 mg total) by mouth every 8 (eight) hours as needed. Patient not taking: Reported on 09/11/2019 07/16/19   Brock Bad, MD  ibuprofen (ADVIL,MOTRIN) 600 MG tablet Take 1 tablet (600 mg total) by mouth every 6 (six) hours as needed. Patient not taking: Reported on 03/28/2019 06/30/18   Arabella Merles, CNM  medroxyPROGESTERone (DEPO-PROVERA) 150 MG/ML injection Inject 1 mL (150 mg total) into the muscle every 3 (three) months. Patient not taking: Reported on 09/11/2019 07/16/19   Brock Bad, MD  Prenatal Vit-Fe Fumarate-FA (MULTIVITAMIN-PRENATAL) 27-0.8 MG TABS tablet Take 1 tablet by mouth daily at 12 noon. 07/16/19   Brock Bad, MD  prenatal vitamin w/FE, FA (PRENATAL 1 + 1) 27-1 MG TABS tablet  Take 1 tablet by mouth daily before breakfast. Patient not taking: Reported on 09/11/2019 07/16/19   Brock Bad, MD  thiamine (VITAMIN B-1) 100 MG tablet Take 100 mg by mouth daily.    [provider]  tinidazole (TINDAMAX) 500 MG tablet Take 2 tablets (1,000 mg total) by mouth daily with breakfast. Patient not taking: Reported on 09/11/2019 07/18/19   Brock Bad, MD    Allergies    Patient has no known allergies.  Review of Systems   Review of Systems  All other systems reviewed and are negative.   Physical Exam Updated Vital Signs BP (!) 122/93   Pulse 80   Temp 98.3 F (36.8 C) (Oral)   Resp 18   SpO2 99%   Physical Exam Vitals and nursing note reviewed.  Constitutional:      General: She is not in acute distress.    Appearance: She is well-developed.  HENT:     Head: Normocephalic and atraumatic.     Mouth/Throat:     Comments: Mild dry  cough Eyes:     Conjunctiva/sclera: Conjunctivae normal.  Cardiovascular:     Rate and Rhythm: Normal rate and regular rhythm.     Heart sounds: No murmur heard.   Pulmonary:     Effort: Pulmonary effort is normal. No respiratory distress.     Breath sounds: Normal breath sounds.  Abdominal:     Palpations: Abdomen is soft.     Tenderness: There is no abdominal tenderness.  Musculoskeletal:        General: Normal range of motion.     Cervical back: Neck supple.     Comments: Moves all extremities  Skin:    General: Skin is warm and dry.  Neurological:     Mental Status: She is alert and oriented to person, place, and time.  Psychiatric:        Mood and Affect: Mood normal.        Behavior: Behavior normal.     ED Results / Procedures / Treatments   Labs (all labs ordered are listed, but only abnormal results are displayed) Labs Reviewed  SARS CORONAVIRUS 2 BY RT PCR (HOSPITAL ORDER, PERFORMED IN [facility])    EKG None  Radiology No results found.  Procedures Procedures (including critical care time)  Medications Ordered in ED Medications - No data to display  ED Course  I have reviewed the triage vital signs and the nursing notes.  Pertinent labs & imaging results that were available during my care of the patient were reviewed by me and considered in my medical decision making (see chart for details).    MDM Rules/Calculators/A&P                         Halen Mossbarger was evaluated in Emergency Department on 04/28/2020 for the symptoms described in the history of present illness. She was evaluated in the context of the global COVID-19 pandemic, which necessitated consideration that the patient might be at risk for infection with the SARS-CoV-2 virus that causes COVID-19. Institutional protocols and algorithms that pertain to the evaluation of patients at risk for COVID-19 are in a state of rapid change based on information released by regulatory  bodies including the CDC and federal and state organizations. These policies and algorithms were followed during the patient's care in the ED.  Patient here with symptoms consistent with Covid-19.  Son was recently tested positive.  Will  give quarantine instructions.  Patient is nontoxic in appearance.  Vital signs are stable.  Do not feel that any additional emergent work-up or treatment is indicated. Final Clinical Impression(s) / ED Diagnoses Final diagnoses:  Person under investigation for COVID-19    Rx / DC Orders ED Discharge Orders    None       Roxy Horseman, PA-C 04/28/20 2255    Maia Plan, MD 04/29/20 1046

## 2020-04-29 LAB — SARS CORONAVIRUS 2 BY RT PCR (HOSPITAL ORDER, PERFORMED IN ~~LOC~~ HOSPITAL LAB): SARS Coronavirus 2: POSITIVE — AB

## 2020-05-07 ENCOUNTER — Ambulatory Visit: Payer: Medicaid Other

## 2020-07-20 ENCOUNTER — Other Ambulatory Visit: Payer: Self-pay

## 2020-07-20 ENCOUNTER — Ambulatory Visit (INDEPENDENT_AMBULATORY_CARE_PROVIDER_SITE_OTHER): Payer: Medicaid Other

## 2020-07-20 DIAGNOSIS — Z3202 Encounter for pregnancy test, result negative: Secondary | ICD-10-CM | POA: Diagnosis not present

## 2020-07-20 DIAGNOSIS — Z3042 Encounter for surveillance of injectable contraceptive: Secondary | ICD-10-CM

## 2020-07-20 LAB — POCT URINE PREGNANCY: Preg Test, Ur: NEGATIVE

## 2020-07-20 MED ORDER — MEDROXYPROGESTERONE ACETATE 150 MG/ML IM SUSP: 150.0000 mg | INTRAMUSCULAR | 4 refills | Status: DC

## 2020-07-20 MED ORDER — MEDROXYPROGESTERONE ACETATE 150 MG/ML IM SUSP
150.0000 mg | Freq: Once | INTRAMUSCULAR | Status: AC
  Administered 2020-07-20: 150 mg via INTRAMUSCULAR

## 2020-07-20 NOTE — Progress Notes (Signed)
SUBJECTIVE GYN presents for UPT/DEPO restart.  LMP 07/18/2020, patient is within 7 days of start of last period. Last unprotected sex 05/30/2020.  UPT today is NEGATIVE  Last PAP 07/16/2019, needs AEX.  Depo given in RUOQ, tolerated well. Next DEPO  Feb. 21 - Mar. 7, 2022  Administrations This Visit    medroxyPROGESTERone (DEPO-PROVERA) injection 150 mg    Admin Date 07/20/2020 Action Given Dose 150 mg Route Intramuscular Administered By Maretta Bees, RMA

## 2020-07-20 NOTE — Progress Notes (Signed)
Patient was assessed and managed by nursing staff during this encounter. I have reviewed the chart and agree with the documentation and plan. I have also made any necessary editorial changes.  Damondre Pfeifle A Zavior Thomason, MD 07/20/2020 6:19 PM   

## 2020-08-17 ENCOUNTER — Ambulatory Visit: Payer: Medicaid Other | Admitting: Obstetrics

## 2020-08-28 ENCOUNTER — Ambulatory Visit: Payer: Medicaid Other | Admitting: Obstetrics

## 2020-10-06 ENCOUNTER — Ambulatory Visit: Payer: Medicaid Other | Admitting: Obstetrics

## 2020-10-12 ENCOUNTER — Ambulatory Visit: Payer: Medicaid Other

## 2020-10-13 ENCOUNTER — Ambulatory Visit: Payer: Medicaid Other

## 2021-01-14 ENCOUNTER — Ambulatory Visit (INDEPENDENT_AMBULATORY_CARE_PROVIDER_SITE_OTHER): Payer: Medicaid Other

## 2021-01-14 ENCOUNTER — Other Ambulatory Visit: Payer: Self-pay

## 2021-01-14 DIAGNOSIS — Z3202 Encounter for pregnancy test, result negative: Secondary | ICD-10-CM

## 2021-01-14 LAB — POCT URINE PREGNANCY: Preg Test, Ur: NEGATIVE

## 2021-01-14 NOTE — Progress Notes (Addendum)
Subjective:  Loretta Brewer is a 35 y.o. female here for 1st. UPT to restart DEPO.  Objective:  There were no vitals taken for this visit.  Appearance alert, well appearing, and in no distress.   Assessment:   UPT today is NEGATIVE.  Patient had sexual intercourse this Morning.  Plan:  RTO in two weeks for 2nd. UPT and DEPO restart. Abstain from sex for the next 2 weeks.  Pick up DEPO Rx and take with you to your 2 weeks appt.

## 2021-01-26 ENCOUNTER — Ambulatory Visit (INDEPENDENT_AMBULATORY_CARE_PROVIDER_SITE_OTHER): Payer: 59 | Admitting: *Deleted

## 2021-01-26 ENCOUNTER — Other Ambulatory Visit: Payer: Self-pay

## 2021-01-26 DIAGNOSIS — Z3202 Encounter for pregnancy test, result negative: Secondary | ICD-10-CM | POA: Diagnosis not present

## 2021-01-26 DIAGNOSIS — Z3042 Encounter for surveillance of injectable contraceptive: Secondary | ICD-10-CM | POA: Diagnosis not present

## 2021-01-26 LAB — POCT URINE PREGNANCY: Preg Test, Ur: NEGATIVE

## 2021-01-26 MED ORDER — PRENATAL 27-0.8 MG PO TABS: 1.0000 | ORAL_TABLET | Freq: Every day | ORAL | 11 refills | Status: DC

## 2021-01-26 NOTE — Progress Notes (Signed)
Pt is in office for 2nd UPT for Depo restart.  UPT in office is negative today. Depo given, pt tolerated well.  Pt advised to RTO 8/31-9/13 for next Depo.  Administrations This Visit     medroxyPROGESTERone (DEPO-PROVERA) injection 150 mg     Admin Date 01/26/2021 Action Given Dose 150 mg Route Intramuscular Administered By Lanney Gins, CMA

## 2021-01-28 ENCOUNTER — Ambulatory Visit: Payer: Medicaid Other

## 2021-04-21 ENCOUNTER — Ambulatory Visit: Payer: Medicaid Other

## 2021-04-27 ENCOUNTER — Ambulatory Visit: Payer: 59

## 2021-05-12 ENCOUNTER — Ambulatory Visit (INDEPENDENT_AMBULATORY_CARE_PROVIDER_SITE_OTHER): Payer: Medicaid Other

## 2021-05-12 ENCOUNTER — Other Ambulatory Visit: Payer: Self-pay

## 2021-05-12 ENCOUNTER — Ambulatory Visit: Payer: 59

## 2021-05-12 DIAGNOSIS — Z30013 Encounter for initial prescription of injectable contraceptive: Secondary | ICD-10-CM

## 2021-05-12 LAB — POCT URINE PREGNANCY: Preg Test, Ur: NEGATIVE

## 2021-05-12 NOTE — Progress Notes (Signed)
Patient presents for 1st upt for depo restart. Patient is 14 days past her window of 8/30-9/13. Patient advised to abstain from sexual intercourse until after she has completed her second upt and receive depo. Patient states that she last un protected intercourse 2 days ago. Patient verbalized understand. Patient also advised to schedule AEX.

## 2021-05-12 NOTE — Progress Notes (Signed)
Agree with A & P. 

## 2021-05-26 ENCOUNTER — Ambulatory Visit (INDEPENDENT_AMBULATORY_CARE_PROVIDER_SITE_OTHER): Payer: Medicaid Other | Admitting: *Deleted

## 2021-05-26 ENCOUNTER — Other Ambulatory Visit: Payer: Self-pay

## 2021-05-26 VITALS — BP 107/68 | HR 76

## 2021-05-26 DIAGNOSIS — Z3042 Encounter for surveillance of injectable contraceptive: Secondary | ICD-10-CM

## 2021-05-26 DIAGNOSIS — Z30013 Encounter for initial prescription of injectable contraceptive: Secondary | ICD-10-CM | POA: Diagnosis not present

## 2021-05-26 LAB — POCT URINE PREGNANCY: Preg Test, Ur: NEGATIVE

## 2021-05-26 MED ORDER — MEDROXYPROGESTERONE ACETATE 150 MG/ML IM SUSP
150.0000 mg | Freq: Once | INTRAMUSCULAR | Status: AC
  Administered 2021-05-26: 150 mg via INTRAMUSCULAR

## 2021-05-26 NOTE — Progress Notes (Signed)
Date last pap: 07/16/19. Last Depo-Provera: 01/26/21. Side Effects if any: na. Serum HCG indicated? neg. Depo-Provera 150 mg IM given by: Selena Batten. Anica Alcaraz, RNC, RUO glute. Next appointment due 08-11-21 to 08/25/21.

## 2021-06-17 ENCOUNTER — Other Ambulatory Visit (HOSPITAL_COMMUNITY)
Admission: RE | Admit: 2021-06-17 | Discharge: 2021-06-17 | Disposition: A | Discharge status: Home / Self Care | Payer: Medicaid Other | Source: Ambulatory Visit | Attending: Obstetrics | Admitting: Obstetrics

## 2021-06-17 ENCOUNTER — Ambulatory Visit (INDEPENDENT_AMBULATORY_CARE_PROVIDER_SITE_OTHER): Payer: Medicaid Other | Admitting: Obstetrics

## 2021-06-17 ENCOUNTER — Encounter: Payer: Self-pay | Admitting: Obstetrics

## 2021-06-17 ENCOUNTER — Other Ambulatory Visit: Payer: Self-pay

## 2021-06-17 VITALS — BP 110/72 | HR 79 | Ht 66.5 in | Wt 148.9 lb

## 2021-06-17 DIAGNOSIS — Z01419 Encounter for gynecological examination (general) (routine) without abnormal findings: Secondary | ICD-10-CM

## 2021-06-17 DIAGNOSIS — N898 Other specified noninflammatory disorders of vagina: Secondary | ICD-10-CM | POA: Insufficient documentation

## 2021-06-17 DIAGNOSIS — Z3042 Encounter for surveillance of injectable contraceptive: Secondary | ICD-10-CM | POA: Diagnosis not present

## 2021-06-17 DIAGNOSIS — Z113 Encounter for screening for infections with a predominantly sexual mode of transmission: Secondary | ICD-10-CM | POA: Diagnosis not present

## 2021-06-17 MED ORDER — PRENATAL 27-0.8 MG PO TABS: 1.0000 | ORAL_TABLET | Freq: Every day | ORAL | 11 refills | Status: DC

## 2021-06-17 MED ORDER — MEDROXYPROGESTERONE ACETATE 150 MG/ML IM SUSP: 150.0000 mg | INTRAMUSCULAR | 4 refills | Status: DC

## 2021-06-17 NOTE — Progress Notes (Signed)
Subjective:        Loretta Brewer is a 35 y.o. female here for a routine exam.  Current complaints: Vaginal discharge.    Personal health questionnaire:  Is patient [name], have a family history of breast and/or ovarian cancer: no Is there a family history of uterine cancer diagnosed at age < 59, gastrointestinal cancer, urinary tract cancer, family member who is a Personnel officer syndrome-associated carrier: no Is the patient overweight and hypertensive, family history of diabetes, personal history of gestational diabetes, preeclampsia or PCOS: no Is patient over 77, have PCOS,  family history of premature CHD under age 103, diabetes, smoke, have hypertension or peripheral artery disease:  no At any time, has a partner hit, kicked or otherwise hurt or frightened you?: no Over the past 2 weeks, have you felt down, depressed or hopeless?: no Over the past 2 weeks, have you felt little interest or pleasure in doing things?:no   Gynecologic History No LMP recorded. Patient has had an injection. Contraception: Depo-Provera injections Last Pap: 2020. Results were: normal Last mammogram: n/a. Results were: n/a  Obstetric History OB History  Gravida Para Term Preterm AB Living  5 3 3   2 3   SAB IAB Ectopic Multiple Live Births    2   0 3    # Outcome Date GA Lbr Len/2nd Weight Sex Delivery Anes PTL Lv  5 Term 06/29/18 [redacted]w[redacted]d 00:42 / 00:18 5 lb 15.4 oz (2.705 kg) F Vag-Spont EPI  LIV  4 Term           3 Term           2 IAB           1 IAB             Past Medical History:  Diagnosis Date   Anemia    Sleep apnea     Past Surgical History:  Procedure Laterality Date   DENTAL SURGERY       Current Outpatient Medications:    Prenatal Vit-Fe Fumarate-FA (MULTIVITAMIN-PRENATAL) 27-0.8 MG TABS tablet, Take 1 tablet by mouth daily at 12 noon., Disp: 30 tablet, Rfl: 11   ALPRAZolam (XANAX) 0.25 MG tablet, Take 1 tablet (0.25 mg total) by mouth 3 (three) times daily as needed for  anxiety. (Patient not taking: No sig reported), Disp: 10 tablet, Rfl: 0   ferrous sulfate 325 (65 FE) MG tablet, Take 1 tablet (325 mg total) by mouth 2 (two) times daily with a meal. (Patient not taking: No sig reported), Disp: 60 tablet, Rfl: 5   ibuprofen (ADVIL) 800 MG tablet, Take 1 tablet (800 mg total) by mouth every 8 (eight) hours as needed. (Patient not taking: No sig reported), Disp: 30 tablet, Rfl: 5   ibuprofen (ADVIL,MOTRIN) 600 MG tablet, Take 1 tablet (600 mg total) by mouth every 6 (six) hours as needed. (Patient not taking: No sig reported), Disp: 30 tablet, Rfl: 0   medroxyPROGESTERone (DEPO-PROVERA) 150 MG/ML injection, Inject 1 mL (150 mg total) into the muscle every 3 (three) months. (Patient not taking: No sig reported), Disp: 1 mL, Rfl: 3   medroxyPROGESTERone (DEPO-PROVERA) 150 MG/ML injection, Inject 1 mL (150 mg total) into the muscle every 3 (three) months., Disp: 1 mL, Rfl: 4   prenatal vitamin w/FE, FA (PRENATAL 1 + 1) 27-1 MG TABS tablet, Take 1 tablet by mouth daily before breakfast. (Patient not taking: No sig reported), Disp: 30 tablet, Rfl: 11   thiamine (VITAMIN B-1) 100  MG tablet, Take 100 mg by mouth daily. (Patient not taking: Reported on 06/17/2021), Disp: , Rfl:   Current Facility-Administered Medications:    medroxyPROGESTERone (DEPO-PROVERA) injection 150 mg, 150 mg, Intramuscular, Once, Brock Bad, MD   medroxyPROGESTERone (DEPO-PROVERA) injection 150 mg, 150 mg, Intramuscular, Q90 days, Brock Bad, MD, 150 mg at 01/26/21 1528 No Known Allergies  Social History   Tobacco Use   Smoking status: Every Day    Packs/day: 0.25    Types: Cigarettes   Smokeless tobacco: Never  Substance Use Topics   Alcohol use: Not Currently    Comment: early in pregnacy not anymore "been a couple months"    Family History  Problem Relation Age of Onset   Hypertension Father       Review of Systems  Constitutional: negative for fatigue and weight  loss Respiratory: negative for cough and wheezing Cardiovascular: negative for chest pain, fatigue and palpitations Gastrointestinal: negative for abdominal pain and change in bowel habits Musculoskeletal:negative for myalgias Neurological: negative for gait problems and tremors Behavioral/Psych: negative for abusive relationship, depression Endocrine: negative for temperature intolerance    Genitourinary: positive for vaginal discharge.  negative for abnormal menstrual periods, genital lesions, hot flashes, sexual problems  Integument/breast: negative for breast lump, breast tenderness, nipple discharge and skin lesion(s)    Objective:       BP 110/72   Pulse 79   Ht 5' 6.5" (1.689 m)   Wt 148 lb 14.4 oz (67.5 kg)   BMI 23.67 kg/m  General:   Alert and no distress  Skin:   no rash or abnormalities  Lungs:   clear to auscultation bilaterally  Heart:   regular rate and rhythm, S1, S2 normal, no murmur, click, rub or gallop  Breasts:   normal without suspicious masses, skin or nipple changes or axillary nodes  Abdomen:  normal findings: no organomegaly, soft, non-tender and no hernia  Pelvis:  External genitalia: normal general appearance Urinary system: urethral meatus normal and bladder without fullness, nontender Vaginal: normal without tenderness, induration or masses Cervix: normal appearance Adnexa: normal bimanual exam Uterus: anteverted and non-tender, normal size   Lab Review Urine pregnancy test Labs reviewed yes Radiologic studies reviewed no  I have spent a total of 15 minutes of face-to-face time, excluding clinical staff time, reviewing notes and preparing to see patient, ordering tests and/or medications, and counseling the patient.   Assessment:    1. Encounter for routine gynecological examination with Papanicolaou smear of cervix Rx: - Cytology - PAP( [facility])  2. Vaginal discharge Rx: - Cervicovaginal ancillary only  3. Screening for STD  (sexually transmitted disease) Rx: - Hepatitis B surface antigen - Hepatitis C antibody - RPR - HIV Antibody (routine testing w rflx)  4. Encounter for surveillance of injectable contraceptive Rx: - medroxyPROGESTERone (DEPO-PROVERA) 150 MG/ML injection; Inject 1 mL (150 mg total) into the muscle every 3 (three) months.  Dispense: 1 mL; Refill: 4     Plan:    Education reviewed: calcium supplements, depression evaluation, low fat, low cholesterol diet, safe sex/STD prevention, self breast exams, and weight bearing exercise. Contraception: Depo-Provera injections. Follow up in: 1 year.     Brock Bad, MD 06/17/2021 9:43 AM

## 2021-06-17 NOTE — Progress Notes (Signed)
Pt presents for annual, pap, and all STD testing.   

## 2021-06-18 LAB — HIV ANTIBODY (ROUTINE TESTING W REFLEX): HIV Screen 4th Generation wRfx: NONREACTIVE

## 2021-06-18 LAB — RPR: RPR Ser Ql: NONREACTIVE

## 2021-06-18 LAB — HEPATITIS B SURFACE ANTIGEN: Hepatitis B Surface Ag: NEGATIVE

## 2021-06-18 LAB — HEPATITIS C ANTIBODY: Hep C Virus Ab: <0.1 s/co ratio (ref 0.0–0.9)

## 2021-06-21 LAB — CERVICOVAGINAL ANCILLARY ONLY
Bacterial Vaginitis (gardnerella): POSITIVE — AB
Candida Glabrata: NEGATIVE
Candida Vaginitis: NEGATIVE
Chlamydia: NEGATIVE
Comment: NEGATIVE
Comment: NEGATIVE
Comment: NEGATIVE
Comment: NEGATIVE
Comment: NEGATIVE
Comment: NORMAL
Neisseria Gonorrhea: NEGATIVE
Trichomonas: NEGATIVE

## 2021-06-22 ENCOUNTER — Other Ambulatory Visit: Payer: Self-pay | Admitting: *Deleted

## 2021-06-22 ENCOUNTER — Telehealth: Payer: Self-pay | Admitting: *Deleted

## 2021-06-22 ENCOUNTER — Encounter: Payer: Self-pay | Admitting: *Deleted

## 2021-06-22 DIAGNOSIS — B9689 Other specified bacterial agents as the cause of diseases classified elsewhere: Secondary | ICD-10-CM

## 2021-06-22 LAB — CYTOLOGY - PAP
Comment: NEGATIVE
Comment: NEGATIVE
Diagnosis: NEGATIVE
HPV 16: NEGATIVE
HPV 18 / 45: NEGATIVE
High risk HPV: POSITIVE — AB

## 2021-06-22 MED ORDER — METRONIDAZOLE 500 MG PO TABS: 500.0000 mg | ORAL_TABLET | Freq: Two times a day (BID) | ORAL | 0 refills | Status: DC

## 2021-06-22 NOTE — Telephone Encounter (Signed)
Returned TC to patient regarding recent test results. No answer. Left HIPPA compliant VM for patient to call the office or check MyChart for communication. MyChart message regarding positive BV and RX Flagyl and HR HPV on Pap and need to follow up in one year for repeat sent. Patient education on BV included.

## 2021-06-24 ENCOUNTER — Other Ambulatory Visit: Payer: Self-pay | Admitting: Obstetrics

## 2021-07-26 ENCOUNTER — Encounter: Payer: Self-pay | Admitting: Obstetrics

## 2021-07-27 ENCOUNTER — Other Ambulatory Visit: Payer: Self-pay

## 2021-07-27 DIAGNOSIS — B379 Candidiasis, unspecified: Secondary | ICD-10-CM

## 2021-07-27 MED ORDER — FLUCONAZOLE 150 MG PO TABS: 150.0000 mg | ORAL_TABLET | Freq: Once | ORAL | 0 refills | Status: AC

## 2021-08-19 ENCOUNTER — Ambulatory Visit: Payer: 59

## 2021-10-17 ENCOUNTER — Emergency Department (HOSPITAL_COMMUNITY): Payer: Medicaid Other

## 2021-10-17 ENCOUNTER — Other Ambulatory Visit: Payer: Self-pay

## 2021-10-17 ENCOUNTER — Encounter (HOSPITAL_COMMUNITY): Payer: Self-pay

## 2021-10-17 ENCOUNTER — Emergency Department (HOSPITAL_COMMUNITY)
Admission: EM | Admit: 2021-10-17 | Discharge: 2021-10-17 | Disposition: A | Discharge status: Home / Self Care | Payer: Medicaid Other | Attending: Emergency Medicine | Admitting: Emergency Medicine

## 2021-10-17 DIAGNOSIS — R202 Paresthesia of skin: Secondary | ICD-10-CM | POA: Insufficient documentation

## 2021-10-17 DIAGNOSIS — M795 Residual foreign body in soft tissue: Secondary | ICD-10-CM

## 2021-10-17 DIAGNOSIS — M542 Cervicalgia: Secondary | ICD-10-CM | POA: Insufficient documentation

## 2021-10-17 HISTORY — DX: Accidental discharge from unspecified firearms or gun, initial encounter: W34.00XA

## 2021-10-17 HISTORY — DX: Unspecified firearm discharge, undetermined intent, initial encounter: Y24.9XXA

## 2021-10-17 LAB — CBC WITH DIFFERENTIAL/PLATELET
Abs Immature Granulocytes: 0.02 10*3/uL (ref 0.00–0.07)
Basophils Absolute: 0 10*3/uL (ref 0.0–0.1)
Basophils Relative: 0 %
Eosinophils Absolute: 0.2 10*3/uL (ref 0.0–0.5)
Eosinophils Relative: 3 %
HCT: 39.2 % (ref 36.0–46.0)
Hemoglobin: 12.8 g/dL (ref 12.0–15.0)
Immature Granulocytes: 0 %
Lymphocytes Relative: 25 %
Lymphs Abs: 2 10*3/uL (ref 0.7–4.0)
MCH: 26.9 pg (ref 26.0–34.0)
MCHC: 32.7 g/dL (ref 30.0–36.0)
MCV: 82.5 fL (ref 80.0–100.0)
Monocytes Absolute: 0.3 10*3/uL (ref 0.1–1.0)
Monocytes Relative: 4 %
Neutro Abs: 5.5 10*3/uL (ref 1.7–7.7)
Neutrophils Relative %: 68 %
Platelets: 206 10*3/uL (ref 150–400)
RBC: 4.75 MIL/uL (ref 3.87–5.11)
RDW: 13.9 % (ref 11.5–15.5)
WBC: 8 10*3/uL (ref 4.0–10.5)
nRBC: 0 % (ref 0.0–0.2)

## 2021-10-17 LAB — BASIC METABOLIC PANEL
Anion gap: 16 — ABNORMAL HIGH (ref 5–15)
BUN: 7 mg/dL (ref 6–20)
CO2: 23 mmol/L (ref 22–32)
Calcium: 8.5 mg/dL — ABNORMAL LOW (ref 8.9–10.3)
Chloride: 99 mmol/L (ref 98–111)
Creatinine, Ser: 0.72 mg/dL (ref 0.44–1.00)
GFR, Estimated: >60 mL/min (ref >60)
Glucose, Bld: 65 mg/dL — ABNORMAL LOW (ref 70–99)
Potassium: 4.1 mmol/L (ref 3.5–5.1)
Sodium: 138 mmol/L (ref 135–145)

## 2021-10-17 LAB — I-STAT BETA HCG BLOOD, ED (MC, WL, AP ONLY): I-stat hCG, quantitative: <5 m[IU]/mL (ref <5)

## 2021-10-17 MED ORDER — MORPHINE SULFATE (PF) 4 MG/ML IV SOLN
4.0000 mg | Freq: Once | INTRAVENOUS | Status: AC
  Administered 2021-10-17: 4 mg via INTRAVENOUS
  Filled 2021-10-17: qty 1

## 2021-10-17 MED ORDER — ONDANSETRON 4 MG PO TBDP: ORAL_TABLET | ORAL | 0 refills | Status: DC

## 2021-10-17 MED ORDER — ONDANSETRON HCL 4 MG/2ML IJ SOLN
4.0000 mg | Freq: Once | INTRAMUSCULAR | Status: AC
  Administered 2021-10-17: 4 mg via INTRAVENOUS
  Filled 2021-10-17: qty 2

## 2021-10-17 MED ORDER — OXYCODONE-ACETAMINOPHEN 5-325 MG PO TABS
1.0000 | ORAL_TABLET | Freq: Four times a day (QID) | ORAL | 0 refills | Status: DC | PRN
Start: 2021-10-17 — End: 2022-10-10
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
## 2021-10-17 NOTE — ED Notes (Signed)
Pt provided w/ a few ice chips.  ?

## 2021-10-17 NOTE — ED Notes (Signed)
Pt reports headache has resolved, but still having discomfort w/ turning head to the right.  ?

## 2021-10-17 NOTE — ED Notes (Signed)
Patient transported to CT 

## 2022-09-21 ENCOUNTER — Ambulatory Visit: Payer: Medicaid Other

## 2022-09-27 ENCOUNTER — Ambulatory Visit: Payer: Medicaid Other

## 2022-10-07 ENCOUNTER — Ambulatory Visit: Payer: Medicaid Other

## 2022-10-10 ENCOUNTER — Encounter (HOSPITAL_COMMUNITY): Payer: Self-pay | Admitting: Psychiatry

## 2022-10-10 ENCOUNTER — Other Ambulatory Visit (HOSPITAL_COMMUNITY)
Admission: EM | Admit: 2022-10-10 | Discharge: 2022-10-13 | Disposition: A | Discharge status: Home / Self Care | Payer: Medicaid Other | Attending: Psychiatry | Admitting: Psychiatry

## 2022-10-10 DIAGNOSIS — F4310 Post-traumatic stress disorder, unspecified: Secondary | ICD-10-CM | POA: Insufficient documentation

## 2022-10-10 DIAGNOSIS — Z1152 Encounter for screening for COVID-19: Secondary | ICD-10-CM | POA: Insufficient documentation

## 2022-10-10 DIAGNOSIS — F17210 Nicotine dependence, cigarettes, uncomplicated: Secondary | ICD-10-CM | POA: Insufficient documentation

## 2022-10-10 DIAGNOSIS — F1290 Cannabis use, unspecified, uncomplicated: Secondary | ICD-10-CM | POA: Insufficient documentation

## 2022-10-10 DIAGNOSIS — E785 Hyperlipidemia, unspecified: Secondary | ICD-10-CM | POA: Insufficient documentation

## 2022-10-10 DIAGNOSIS — F332 Major depressive disorder, recurrent severe without psychotic features: Secondary | ICD-10-CM | POA: Diagnosis not present

## 2022-10-10 DIAGNOSIS — F1010 Alcohol abuse, uncomplicated: Secondary | ICD-10-CM | POA: Insufficient documentation

## 2022-10-10 DIAGNOSIS — F411 Generalized anxiety disorder: Secondary | ICD-10-CM | POA: Diagnosis not present

## 2022-10-10 DIAGNOSIS — F1090 Alcohol use, unspecified, uncomplicated: Secondary | ICD-10-CM | POA: Diagnosis present

## 2022-10-10 DIAGNOSIS — Z79899 Other long term (current) drug therapy: Secondary | ICD-10-CM | POA: Insufficient documentation

## 2022-10-10 DIAGNOSIS — R9431 Abnormal electrocardiogram [ECG] [EKG]: Secondary | ICD-10-CM | POA: Insufficient documentation

## 2022-10-10 LAB — URINALYSIS, COMPLETE (UACMP) WITH MICROSCOPIC
Bacteria, UA: NONE SEEN
Bilirubin Urine: NEGATIVE
Glucose, UA: NEGATIVE mg/dL
Ketones, ur: 20 mg/dL — AB
Leukocytes,Ua: NEGATIVE
Nitrite: NEGATIVE
Protein, ur: NEGATIVE mg/dL
Specific Gravity, Urine: 1.005 (ref 1.005–1.030)
pH: 6 (ref 5.0–8.0)

## 2022-10-10 LAB — MAGNESIUM: Magnesium: 2 mg/dL (ref 1.7–2.4)

## 2022-10-10 LAB — POCT URINE DRUG SCREEN - MANUAL ENTRY (I-SCREEN)
POC Amphetamine UR: NOT DETECTED
POC Buprenorphine (BUP): NOT DETECTED
POC Cocaine UR: NOT DETECTED
POC Marijuana UR: POSITIVE — AB
POC Methadone UR: NOT DETECTED
POC Methamphetamine UR: NOT DETECTED
POC Morphine: NOT DETECTED
POC Oxazepam (BZO): NOT DETECTED
POC Oxycodone UR: NOT DETECTED
POC Secobarbital (BAR): NOT DETECTED

## 2022-10-10 LAB — COMPREHENSIVE METABOLIC PANEL
ALT: 33 U/L (ref 0–44)
AST: 68 U/L — ABNORMAL HIGH (ref 15–41)
Albumin: 4.3 g/dL (ref 3.5–5.0)
Alkaline Phosphatase: 73 U/L (ref 38–126)
Anion gap: 20 — ABNORMAL HIGH (ref 5–15)
BUN: 11 mg/dL (ref 6–20)
CO2: 20 mmol/L — ABNORMAL LOW (ref 22–32)
Calcium: 8.2 mg/dL — ABNORMAL LOW (ref 8.9–10.3)
Chloride: 96 mmol/L — ABNORMAL LOW (ref 98–111)
Creatinine, Ser: 0.8 mg/dL (ref 0.44–1.00)
GFR, Estimated: >60 mL/min (ref >60)
Glucose, Bld: 60 mg/dL — ABNORMAL LOW (ref 70–99)
Potassium: 4 mmol/L (ref 3.5–5.1)
Sodium: 136 mmol/L (ref 135–145)
Total Bilirubin: 1.3 mg/dL — ABNORMAL HIGH (ref 0.3–1.2)
Total Protein: 7.7 g/dL (ref 6.5–8.1)

## 2022-10-10 LAB — CBC WITH DIFFERENTIAL/PLATELET
Abs Immature Granulocytes: 0.03 10*3/uL (ref 0.00–0.07)
Basophils Absolute: 0 10*3/uL (ref 0.0–0.1)
Basophils Relative: 0 %
Eosinophils Absolute: 0.1 10*3/uL (ref 0.0–0.5)
Eosinophils Relative: 1 %
HCT: 38.3 % (ref 36.0–46.0)
Hemoglobin: 12.5 g/dL (ref 12.0–15.0)
Immature Granulocytes: 0 %
Lymphocytes Relative: 17 %
Lymphs Abs: 1.6 10*3/uL (ref 0.7–4.0)
MCH: 27.1 pg (ref 26.0–34.0)
MCHC: 32.6 g/dL (ref 30.0–36.0)
MCV: 83.1 fL (ref 80.0–100.0)
Monocytes Absolute: 0.5 10*3/uL (ref 0.1–1.0)
Monocytes Relative: 5 %
Neutro Abs: 7.3 10*3/uL (ref 1.7–7.7)
Neutrophils Relative %: 77 %
Platelets: 244 10*3/uL (ref 150–400)
RBC: 4.61 MIL/uL (ref 3.87–5.11)
RDW: 14.2 % (ref 11.5–15.5)
WBC: 9.5 10*3/uL (ref 4.0–10.5)
nRBC: 0 % (ref 0.0–0.2)

## 2022-10-10 LAB — LIPID PANEL
Cholesterol: 239 mg/dL — ABNORMAL HIGH (ref 0–200)
HDL: 86 mg/dL (ref >40)
LDL Cholesterol: 116 mg/dL — ABNORMAL HIGH (ref 0–99)
Total CHOL/HDL Ratio: 2.8 RATIO
Triglycerides: 183 mg/dL — ABNORMAL HIGH (ref <150)
VLDL: 37 mg/dL (ref 0–40)

## 2022-10-10 LAB — RESP PANEL BY RT-PCR (RSV, FLU A&B, COVID)  RVPGX2
Influenza A by PCR: NEGATIVE
Influenza B by PCR: NEGATIVE
Resp Syncytial Virus by PCR: NEGATIVE
SARS Coronavirus 2 by RT PCR: NEGATIVE

## 2022-10-10 LAB — TSH: TSH: 1.943 u[IU]/mL (ref 0.350–4.500)

## 2022-10-10 LAB — POC SARS CORONAVIRUS 2 AG: SARSCOV2ONAVIRUS 2 AG: NEGATIVE

## 2022-10-10 LAB — POC URINE PREG, ED: Preg Test, Ur: NEGATIVE

## 2022-10-10 LAB — POCT PREGNANCY, URINE: Preg Test, Ur: NEGATIVE

## 2022-10-10 LAB — ETHANOL: Alcohol, Ethyl (B): 138 mg/dL — ABNORMAL HIGH (ref <10)

## 2022-10-10 LAB — RPR: RPR Ser Ql: NONREACTIVE

## 2022-10-10 LAB — HIV ANTIBODY (ROUTINE TESTING W REFLEX): HIV Screen 4th Generation wRfx: NONREACTIVE

## 2022-10-10 MED ORDER — ALUM & MAG HYDROXIDE-SIMETH 200-200-20 MG/5ML PO SUSP
30.0000 mL | ORAL | Status: DC | PRN
  Administered 2022-10-10: 30 mL via ORAL
  Filled 2022-10-10: qty 30

## 2022-10-10 MED ORDER — ONDANSETRON 4 MG PO TBDP
4.0000 mg | ORAL_TABLET | Freq: Four times a day (QID) | ORAL | Status: DC | PRN
  Administered 2022-10-10 – 2022-10-11 (×3): 4 mg via ORAL
  Filled 2022-10-10 (×3): qty 1

## 2022-10-10 MED ORDER — ACETAMINOPHEN 325 MG PO TABS: 650.0000 mg | ORAL_TABLET | Freq: Four times a day (QID) | ORAL | Status: DC | PRN

## 2022-10-10 MED ORDER — IBUPROFEN 400 MG PO TABS
400.0000 mg | ORAL_TABLET | Freq: Four times a day (QID) | ORAL | Status: DC | PRN
  Administered 2022-10-10: 400 mg via ORAL
  Filled 2022-10-10: qty 1

## 2022-10-10 MED ORDER — ADULT MULTIVITAMIN W/MINERALS CH
1.0000 | ORAL_TABLET | Freq: Every day | ORAL | Status: DC
  Administered 2022-10-10 – 2022-10-12 (×3): 1 via ORAL
  Filled 2022-10-10 (×4): qty 1

## 2022-10-10 MED ORDER — THIAMINE HCL 100 MG/ML IJ SOLN: 100.0000 mg | Freq: Once | INTRAMUSCULAR | Status: DC

## 2022-10-10 MED ORDER — MAGNESIUM HYDROXIDE 400 MG/5ML PO SUSP: 30.0000 mL | Freq: Every day | ORAL | Status: DC | PRN

## 2022-10-10 MED ORDER — SERTRALINE HCL 50 MG PO TABS
50.0000 mg | ORAL_TABLET | Freq: Every day | ORAL | Status: DC
  Administered 2022-10-10 – 2022-10-13 (×4): 50 mg via ORAL
  Filled 2022-10-10 (×4): qty 1

## 2022-10-10 MED ORDER — TRAZODONE HCL 50 MG PO TABS
50.0000 mg | ORAL_TABLET | Freq: Every evening | ORAL | Status: DC | PRN
  Administered 2022-10-10 – 2022-10-12 (×2): 50 mg via ORAL
  Filled 2022-10-10 (×2): qty 1

## 2022-10-10 MED ORDER — LORAZEPAM 1 MG PO TABS: 1.0000 mg | ORAL_TABLET | Freq: Four times a day (QID) | ORAL | Status: DC | PRN

## 2022-10-10 MED ORDER — HYDROXYZINE HCL 25 MG PO TABS: 25.0000 mg | ORAL_TABLET | Freq: Four times a day (QID) | ORAL | Status: DC | PRN

## 2022-10-10 MED ORDER — THIAMINE MONONITRATE 100 MG PO TABS
100.0000 mg | ORAL_TABLET | Freq: Every day | ORAL | Status: DC
  Administered 2022-10-11 – 2022-10-12 (×2): 100 mg via ORAL
  Filled 2022-10-10 (×3): qty 1

## 2022-10-10 MED ORDER — LOPERAMIDE HCL 2 MG PO CAPS: 2.0000 mg | ORAL_CAPSULE | ORAL | Status: DC | PRN

## 2022-10-10 MED ORDER — NICOTINE 21 MG/24HR TD PT24: 21.0000 mg | MEDICATED_PATCH | Freq: Every day | TRANSDERMAL | Status: DC

## 2022-10-10 NOTE — ED Notes (Signed)
Patient continues to complain of nausea.  She vomited x1 earlier in the day and received zofran which only eased the nausea minimally.  Patient encouraged to sip ice chips.  No other evidence of withdrawal at this time.  She is able to make needs known to staff. Will monitor and provide a safe environment.

## 2022-10-10 NOTE — ED Notes (Signed)
Pt requested Ibuprofen for menstrual cramps

## 2022-10-10 NOTE — Progress Notes (Signed)
Offsite distribution called for STAT lab pick up.

## 2022-10-10 NOTE — ED Notes (Signed)
Pt complained to nurse that she is nauseous.

## 2022-10-10 NOTE — ED Notes (Signed)
Watching movie with peers

## 2022-10-10 NOTE — ED Notes (Signed)
Pt sitting in cafeteria eating lunch and  interacting with peers. No acute distress noted. No concerns voiced. Informed pt to notify staff with any needs or assistance. Pt verbalized understanding or agreement. Will continue to monitor for safety.

## 2022-10-10 NOTE — ED Notes (Signed)
Snacks given 

## 2022-10-10 NOTE — ED Notes (Signed)
Pt reports Zofran effective in managing nausea. No further complaints noted. Informed pt to notify staff with any needs or concerns. Will continue to monitor for safety.

## 2022-10-10 NOTE — ED Notes (Signed)
Pt is in the bed sleeping. Respirations are even and unlabored. No acute distress noted. Will continue to monitor for safety. 

## 2022-10-10 NOTE — ED Notes (Signed)
Pt is in the dayroom watching TV. Respirations are even and unlabored. No acute distress noted. Will continue to monitor for safety.
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
## 2022-10-10 NOTE — ED Notes (Addendum)
Pt admitted voluntarily to [facility] requesting ETOH detox. Pt states, "I last drank around 0400 this morning. I've been drinking too much and I have to get it together before my probation officer find out. I already do jail time on the weekends". Support and encouragement provided. Pt denies SI/HI/AVH. Denies discomfort at present. Denies withdrawal sx from ETOH. Pleasant and cooperative throughout admission assessment. Skin assessment completed. Oriented to unit. Meal and drink offered. At [place], pt sitting in her room with mask on awaiting covid results. Pt verbally contract for safety. Will monitor for safety.
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
## 2022-10-11 DIAGNOSIS — F1010 Alcohol abuse, uncomplicated: Secondary | ICD-10-CM | POA: Diagnosis not present

## 2022-10-11 DIAGNOSIS — Z1152 Encounter for screening for COVID-19: Secondary | ICD-10-CM | POA: Diagnosis not present

## 2022-10-11 DIAGNOSIS — F332 Major depressive disorder, recurrent severe without psychotic features: Secondary | ICD-10-CM | POA: Diagnosis not present

## 2022-10-11 DIAGNOSIS — F411 Generalized anxiety disorder: Secondary | ICD-10-CM | POA: Diagnosis not present

## 2022-10-11 LAB — GC/CHLAMYDIA PROBE AMP (~~LOC~~) NOT AT ARMC
Chlamydia: NEGATIVE
Comment: NEGATIVE
Comment: NORMAL
Neisseria Gonorrhea: NEGATIVE

## 2022-10-11 LAB — HEPATITIS PANEL, ACUTE
HCV Ab: NONREACTIVE
Hep A IgM: NONREACTIVE
Hep B C IgM: NONREACTIVE
Hepatitis B Surface Ag: NONREACTIVE

## 2022-10-11 LAB — HEMOGLOBIN A1C
Hgb A1c MFr Bld: 5.4 % (ref 4.8–5.6)
Mean Plasma Glucose: 108 mg/dL

## 2022-10-11 NOTE — Group Note (Signed)
Group Topic: Social Support  Group Date: 10/11/2022 Start Time: 2000 End Time: 2100 Facilitators: Leana Roe  Department: [facility]  Number of Participants: 5  Group Focus: clarity of thought and leisure skills Treatment Modality:  Individual Therapy and Leisure Development Interventions utilized were story telling Purpose: express feelings and regain self-worth  Name: Loretta Brewer Date of Birth: 1986/06/29  MR: [id number]    Level of Participation: active Quality of Participation: attentive Interactions with others: gave feedback Mood/Affect: appropriate Triggers (if applicable): n/a Cognition: coherent/clear Progress: Moderate Response: n/a Plan: follow-up needed  Patients Problems:  Patient Active Problem List   Diagnosis Date Noted   Alcohol use disorder 10/10/2022   Late prenatal care 06/30/2018   Labor and delivery, indication for care 06/29/2018   Supervision of normal pregnancy 05/07/2018
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
## 2022-10-11 NOTE — ED Notes (Signed)
Pt is in the dayroom watching TV. Respirations are even and unlabored. No acute distress noted. Will continue to monitor for safety.

## 2022-10-11 NOTE — ED Notes (Signed)
Pt sitting in dayroom interacting with peers. No acute distress noted. No concerns voiced. Informed pt to notify staff with any needs or assistance. Pt verbalized understanding or agreement. Will continue to monitor for safety.

## 2022-10-11 NOTE — ED Notes (Signed)
Snacks given 

## 2022-10-11 NOTE — ED Notes (Signed)
Patient is in Group with the Social Worker watching and discussing an addiction video.

## 2022-10-11 NOTE — ED Notes (Signed)
Pt sleeping@this time. Breathing even and unlabored. Will continue to monitor for safety 
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
## 2022-10-11 NOTE — ED Notes (Signed)
Pt sitting in cafeteria eating dinner and interacting with peers. No acute distress noted. No concerns voiced. Informed pt to notify staff with any needs or assistance. Pt verbalized understanding or agreement. Will continue to monitor for safety.

## 2022-10-11 NOTE — ED Notes (Addendum)
Pt sleeping in no acute distress. RR even and unlabored. Environment secured. Will continue to monitor for safety. 

## 2022-10-11 NOTE — ED Notes (Signed)
Patient A&Ox4. Denies intent to harm self/others when asked. Denies A/VH. Patient denies any physical complaints when asked. No acute distress noted. Pt reports sleeping "good" last night. Routine safety checks conducted according to facility protocol. Encouraged patient to notify staff if thoughts of harm toward self or others arise. Patient verbalize understanding and agreement. Will continue to monitor for safety.

## 2022-10-11 NOTE — ED Notes (Signed)
Watching movie with peers

## 2022-10-11 NOTE — Discharge Instructions (Addendum)
Trauma Focused Therapist:  Greg Cutter, Clinical Social Work/Therapist, MSW, [place], [place] [address]  [phone]  Mardelle Matte, Marriage & Family Therapist, LMFT, [place], MFT [place], [place] [address]  [phone]   List of Residential placements:   [facility] Treatment: [phone]  Benita Stabile, [place]: Female and female facility; 30-day program: (uninsured and Medicaid such as Deloria Lair, [place], [place], partners)  [place]: [phone]; men and women's facility; 28 days; Can have Medicaid tailored plan Publishing rights manager or Partners)  [place]: [phone] Janace Hoard or Jeani Hawking; 28 day program; must be fully detox; tailored Medicaid or no insurance  [place] in [place], [place]; [phone]; 28 day all males program; no insurance accepted  [facility]erral in [place]: Wille Glaser [phone] (no insurance or Medicaid only); 90 days; outpatient services but provide housing in apartments downtown [place] Admission: [phone]: Patient must complete phone screening for placement: [place], [place]; 6 month program; uninsured, Medicaid, and [employer].   [facility]: no insurance required; [place]: [phone]; Intake: Herbie Baltimore; Must fill out application online; Christy Sartorius Delay [phone] x [place] in [place], [place]: [phone]; Admissions Coordinators Mr. Simona Huh or Jene Every; 90 day program.  [facility]: [place], [place] [phone]; Co-Ed 9 month to a year program; Online application; Men entry fee is $500 (6-342month);  [facility] [address] [place] [phone] no fee or insurance required; minimum of 2 years; Highly structured; work based; Intake Coordinator is CGerald [phone] [facility] [place] [phone]  Fax number is [phone] website: www.[facility].org; Requires 3-6 page autobiography; 2 year program (18 months and then 670monthransitional housing); Admission fee is $300; no insurance needed; work prFacilities managern SnInterlochen[place]FrDefiancetaff: ReMichel [phone] have a Men's Regenerations Program 6-42m83monthFree program; There is an initial $300 fee however, they are willing to work with patients regarding that. Application is online.  [facility] [address]; Any 7-90 day program is out of pocket; 12 month program is free of charge; there is a $275 entry fee; Patient is responsible for own transportation   [facility],[phone] phone  New Patient Assessment/Therapy Walk-Ins:  Monday and Wednesday: 8 am until slots are full. Every 1st and 2nd Fridays of the month: 1 pm - 5 pm.  NO ASSESSMENT/THERAPY WALK-INS ON TUEVale [place]w Patient Assessment/Medication Management Walk-Ins:  Monday - Friday:  8 am - 11 am.  For all walk-ins, we ask that you arrive by 7:30 am because patients will be seen in the order of arrival.  Availability is limited; therefore, you may not be seen on the same day that you walk-in.  Our goal is to serve and meet the needs of our community to the best of our ability.  SUBSTANCE USE TREATMENT for Medicaid and State Funded/IPRS  [facility] ([facility]) [address],[phone] phone NOTE: [facility] is no longer offering IOP services.  Serves those who are low-income or have no insurance.  [facility] [address],[phone] [phone] NOTE: Does have Substance Abuse-Intensive Outpatient Program ([facility] L[facility] well as transitional housing if eligible.  [place],[phone]  phone [phone] fax  [facility] [phone] [place], [place], [address] [phone] phone [phone] fax  HALFWAY HOUSES:  [facility] [phone]  [employer].oxfordvacancies.com  12 STEP PROGRAMS:  Alcoholics Anonymous of [place] ReportZoo.com.cy  Narcotics Anonymous of [place] GreenScrapbooking.dk  Al-Anon of [employer], [place] www.g[place]alanon.org/find-meetings.html  Nar-Anon https://nar-anon.org/find-[email or web address]-meetin

## 2022-10-12 DIAGNOSIS — F332 Major depressive disorder, recurrent severe without psychotic features: Secondary | ICD-10-CM | POA: Diagnosis not present

## 2022-10-12 DIAGNOSIS — Z1152 Encounter for screening for COVID-19: Secondary | ICD-10-CM | POA: Diagnosis not present

## 2022-10-12 DIAGNOSIS — F1010 Alcohol abuse, uncomplicated: Secondary | ICD-10-CM | POA: Diagnosis not present

## 2022-10-12 DIAGNOSIS — F411 Generalized anxiety disorder: Secondary | ICD-10-CM | POA: Diagnosis not present

## 2022-10-12 LAB — CBC WITH DIFFERENTIAL/PLATELET
Abs Immature Granulocytes: 0.02 10*3/uL (ref 0.00–0.07)
Basophils Absolute: 0 10*3/uL (ref 0.0–0.1)
Basophils Relative: 0 %
Eosinophils Absolute: 0.1 10*3/uL (ref 0.0–0.5)
Eosinophils Relative: 2 %
HCT: 35.7 % — ABNORMAL LOW (ref 36.0–46.0)
Hemoglobin: 12.1 g/dL (ref 12.0–15.0)
Immature Granulocytes: 0 %
Lymphocytes Relative: 12 %
Lymphs Abs: 0.8 10*3/uL (ref 0.7–4.0)
MCH: 27.9 pg (ref 26.0–34.0)
MCHC: 33.9 g/dL (ref 30.0–36.0)
MCV: 82.3 fL (ref 80.0–100.0)
Monocytes Absolute: 0.3 10*3/uL (ref 0.1–1.0)
Monocytes Relative: 5 %
Neutro Abs: 5.1 10*3/uL (ref 1.7–7.7)
Neutrophils Relative %: 81 %
Platelets: 181 10*3/uL (ref 150–400)
RBC: 4.34 MIL/uL (ref 3.87–5.11)
RDW: 13.3 % (ref 11.5–15.5)
WBC: 6.3 10*3/uL (ref 4.0–10.5)
nRBC: 0 % (ref 0.0–0.2)

## 2022-10-12 LAB — COMPREHENSIVE METABOLIC PANEL
ALT: 60 U/L — ABNORMAL HIGH (ref 0–44)
AST: 135 U/L — ABNORMAL HIGH (ref 15–41)
Albumin: 4.1 g/dL (ref 3.5–5.0)
Alkaline Phosphatase: 67 U/L (ref 38–126)
Anion gap: 11 (ref 5–15)
BUN: 6 mg/dL (ref 6–20)
CO2: 29 mmol/L (ref 22–32)
Calcium: 9.1 mg/dL (ref 8.9–10.3)
Chloride: 97 mmol/L — ABNORMAL LOW (ref 98–111)
Creatinine, Ser: 0.82 mg/dL (ref 0.44–1.00)
GFR, Estimated: >60 mL/min (ref >60)
Glucose, Bld: 121 mg/dL — ABNORMAL HIGH (ref 70–99)
Potassium: 3.7 mmol/L (ref 3.5–5.1)
Sodium: 137 mmol/L (ref 135–145)
Total Bilirubin: 1 mg/dL (ref 0.3–1.2)
Total Protein: 7.4 g/dL (ref 6.5–8.1)

## 2022-10-12 MED ORDER — VITAMIN B-1 100 MG PO TABS: 100.0000 mg | ORAL_TABLET | Freq: Every day | ORAL | 0 refills | Status: DC

## 2022-10-12 MED ORDER — SERTRALINE HCL 50 MG PO TABS: 50.0000 mg | ORAL_TABLET | Freq: Every day | ORAL | 0 refills | Status: DC

## 2022-10-12 MED ORDER — NALTREXONE HCL 50 MG PO TABS
50.0000 mg | ORAL_TABLET | Freq: Every day | ORAL | Status: DC
  Administered 2022-10-12: 50 mg via ORAL
  Filled 2022-10-12: qty 1

## 2022-10-12 MED ORDER — NALTREXONE HCL 50 MG PO TABS: 25.0000 mg | ORAL_TABLET | Freq: Every day | ORAL | Status: DC

## 2022-10-12 NOTE — ED Notes (Signed)
Pt sitting in dayroom interacting with peers. No acute distress noted. No concerns voiced. Informed pt to notify staff with any needs or assistance. Pt verbalized understanding or agreement. Will continue to monitor for safety.
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
## 2022-10-12 NOTE — ED Notes (Signed)
Pt is currently sleeping, no distress noted, environmental check complete, will continue to monitor patient for safety.  

## 2022-10-12 NOTE — ED Notes (Signed)
Blood draw to R AC x1 complete. No complications noted. Safety maintained.

## 2022-10-12 NOTE — ED Notes (Signed)
Patient A&Ox4. Denies intent to harm self/others when asked. Denies A/VH. Patient denies any physical complaints when asked. No acute distress noted. Routine safety checks conducted according to facility protocol. Encouraged patient to notify staff if thoughts of harm toward self or others arise. Patient verbalize understanding and agreement. Will continue to monitor for safety.

## 2022-10-12 NOTE — ED Notes (Signed)
Patient observed/assessed in room in bed appearing in no immediate distress resting peacefully. Q15 minute checks continued by MHT and nursing staff. Will continue to monitor and support. 

## 2022-10-12 NOTE — ED Notes (Signed)
Pt sleeping in no acute distress. RR even and unlabored. Environment secured. Will continue to monitor for safety. 

## 2022-10-12 NOTE — ED Notes (Signed)
Pt vomited green bile in commode witnessed by Probation officer. Pt given clear gatorade, saltine crackers and ice chips. Pt refused Zofran. MD aware. Pt in room resting. Safety maintained.
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
## 2022-10-12 NOTE — ED Notes (Signed)
Patient is currently sitting in the dining room watching television with the other patients, no distress noted, will continue to monitor patient for safety.

## 2022-10-12 NOTE — Group Note (Signed)
Group Topic: Relaxation  Group Date: 10/12/2022 Start Time: 1030 End Time: 1100 Facilitators: Quentin Cornwall B  Department: [facility]  Number of Participants: 3  Group Focus: relaxation Treatment Modality:  Psychoeducation Interventions utilized were NA Purpose: express feelings  Name: Leylany Hevey Date of Birth: [birth year]  MR: [id number]    Level of Participation: moderate Quality of Participation: attention seeking Interactions with others: gave feedback Mood/Affect: positive Triggers (if applicable): na Cognition: coherent/clear Progress: Moderate Response: na Plan: follow-up needed  Patients Problems:  Patient Active Problem List   Diagnosis Date Noted   Alcohol use disorder 10/10/2022   Late prenatal care 06/30/2018   Labor and delivery, indication for care 06/29/2018   Supervision of normal pregnancy 05/07/2018

## 2022-10-13 DIAGNOSIS — F1010 Alcohol abuse, uncomplicated: Secondary | ICD-10-CM | POA: Diagnosis not present

## 2022-10-13 DIAGNOSIS — Z1152 Encounter for screening for COVID-19: Secondary | ICD-10-CM | POA: Diagnosis not present

## 2022-10-13 DIAGNOSIS — F332 Major depressive disorder, recurrent severe without psychotic features: Secondary | ICD-10-CM | POA: Diagnosis not present

## 2022-10-13 DIAGNOSIS — F411 Generalized anxiety disorder: Secondary | ICD-10-CM | POA: Diagnosis not present

## 2022-10-13 NOTE — Discharge Summary (Signed)
Loretta Brewer to be D/C'd Home per MD order. Discussed with the patient and all questions fully answered. An After Visit Summary was printed and given to the patient. Medication scripts and all belongings were also given to patient. Patient escorted out and D/C home via private auto.  Clois Dupes  10/13/2022 1:09 PM

## 2022-10-13 NOTE — Discharge Planning (Signed)
LCSW spoke with patient on this morning regarding discharge plan. Patient aware that LCSW and MD is awaiting message back from [place] Representative regarding next available appt. Patient's contact information was confirmed [phone]. Patient aware that once update has been provided, LCSW will follow up to inform of appt date and time. Patient expressed understanding and appreciation for LCSW assistance. Patient reports she can call an uber for transport home. Brief supportive counseling was provided to the patient and patient was receptive to the feedback provided. No other needs to report at this time. LCSW to sign off. Please inform if further LCSW needs arise prior to discharge.   Lucius Conn, LCSW Clinical Social Worker [facility] Ph: [phone]

## 2022-10-13 NOTE — Progress Notes (Signed)
Pt is awake, alert and oriented X4. Pt did not voice any complaints of pain or distress noted. No distress noted. Pt denies current SI/HI/AVH, plan or intent. Staff will monitor for pt's safety.

## 2022-10-13 NOTE — ED Notes (Signed)
Patient is in group with provider.

## 2022-10-13 NOTE — Progress Notes (Signed)
Pt is in the dinning area eating lunch and interacting with peers. No distress noted or concerns voiced. Staff will monitor for pt's safety.

## 2022-10-13 NOTE — ED Notes (Signed)
Patient observed/assessed in room in bed appearing in no immediate distress resting peacefully. Q15 minute checks continued by MHT and nursing staff. Will continue to monitor and support. 

## 2022-10-13 NOTE — ED Notes (Signed)
Patient  refused thiamin and her multivitamin  this am will notify provider.

## 2022-10-21 ENCOUNTER — Other Ambulatory Visit (HOSPITAL_COMMUNITY)
Admission: RE | Admit: 2022-10-21 | Discharge: 2022-10-21 | Disposition: A | Discharge status: Home / Self Care | Payer: Medicaid Other | Source: Ambulatory Visit | Attending: Obstetrics | Admitting: Obstetrics

## 2022-10-21 ENCOUNTER — Encounter: Payer: Self-pay | Admitting: Obstetrics

## 2022-10-21 ENCOUNTER — Ambulatory Visit (INDEPENDENT_AMBULATORY_CARE_PROVIDER_SITE_OTHER): Payer: Medicaid Other | Admitting: Obstetrics

## 2022-10-21 VITALS — BP 101/62 | HR 78 | Ht 66.0 in | Wt 152.0 lb

## 2022-10-21 DIAGNOSIS — N898 Other specified noninflammatory disorders of vagina: Secondary | ICD-10-CM | POA: Insufficient documentation

## 2022-10-21 DIAGNOSIS — Z113 Encounter for screening for infections with a predominantly sexual mode of transmission: Secondary | ICD-10-CM

## 2022-10-21 DIAGNOSIS — Z01419 Encounter for gynecological examination (general) (routine) without abnormal findings: Secondary | ICD-10-CM

## 2022-10-21 DIAGNOSIS — Z1339 Encounter for screening examination for other mental health and behavioral disorders: Secondary | ICD-10-CM | POA: Diagnosis not present

## 2022-10-21 DIAGNOSIS — Z3042 Encounter for surveillance of injectable contraceptive: Secondary | ICD-10-CM | POA: Diagnosis not present

## 2022-10-21 DIAGNOSIS — Z3202 Encounter for pregnancy test, result negative: Secondary | ICD-10-CM

## 2022-10-21 DIAGNOSIS — F17210 Nicotine dependence, cigarettes, uncomplicated: Secondary | ICD-10-CM

## 2022-10-21 LAB — POCT URINE PREGNANCY: Preg Test, Ur: NEGATIVE

## 2022-10-21 MED ORDER — MEDROXYPROGESTERONE ACETATE 150 MG/ML IM SUSY
150.0000 mg | PREFILLED_SYRINGE | Freq: Once | INTRAMUSCULAR | Status: AC
  Administered 2022-10-21: 150 mg via INTRAMUSCULAR

## 2022-10-21 MED ORDER — MEDROXYPROGESTERONE ACETATE 150 MG/ML IM SUSP: 150.0000 mg | INTRAMUSCULAR | 0 refills | Status: DC
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
## 2022-10-21 NOTE — Progress Notes (Signed)
37 y.o. GYN presents for AEX/PAP/STD screening.  Pt would like to restart DEPO. Last sexual intercourse was 1 week ago, Pt did not use any protection.  LMP 09/27/2022.  UPT today is Negative.  DEPO Injection given in RUOQ, tolerated well.  Next DEPO due May 24 - January 20, 2023.  Administrations This Visit     medroxyPROGESTERone Acetate SUSY 150 mg     Admin Date 10/21/2022 Action Given Dose 150 mg Route Intramuscular Administered By Tamela Oddi, RMA

## 2022-10-22 LAB — HEPATITIS B SURFACE ANTIGEN: Hepatitis B Surface Ag: NEGATIVE

## 2022-10-22 LAB — HEPATITIS C ANTIBODY: Hep C Virus Ab: NONREACTIVE

## 2022-10-24 ENCOUNTER — Other Ambulatory Visit: Payer: Self-pay | Admitting: Obstetrics

## 2022-10-24 DIAGNOSIS — N760 Acute vaginitis: Secondary | ICD-10-CM

## 2022-10-24 DIAGNOSIS — A599 Trichomoniasis, unspecified: Secondary | ICD-10-CM

## 2022-10-24 LAB — CERVICOVAGINAL ANCILLARY ONLY
Bacterial Vaginitis (gardnerella): POSITIVE — AB
Candida Glabrata: NEGATIVE
Candida Vaginitis: NEGATIVE
Chlamydia: NEGATIVE
Comment: NEGATIVE
Comment: NEGATIVE
Comment: NEGATIVE
Comment: NEGATIVE
Comment: NEGATIVE
Comment: NORMAL
Neisseria Gonorrhea: NEGATIVE
Trichomonas: POSITIVE — AB

## 2022-10-24 MED ORDER — METRONIDAZOLE 500 MG PO TABS: 500.0000 mg | ORAL_TABLET | Freq: Two times a day (BID) | ORAL | 2 refills | Status: DC

## 2022-10-25 NOTE — Progress Notes (Signed)
TC. No answer. Left HIPAA compliant VM with call back number and indicating MyChart message would be sent. MyChart message sent including DX, pt education, RX, and sex precautions.

## 2022-10-27 LAB — CYTOLOGY - PAP
Comment: NEGATIVE
Diagnosis: NEGATIVE
High risk HPV: NEGATIVE

## 2022-11-07 ENCOUNTER — Encounter: Payer: Self-pay | Admitting: Obstetrics
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
## 2022-12-26 ENCOUNTER — Emergency Department (HOSPITAL_COMMUNITY): Payer: Medicaid Other

## 2022-12-26 ENCOUNTER — Other Ambulatory Visit: Payer: Self-pay

## 2022-12-26 ENCOUNTER — Emergency Department (HOSPITAL_COMMUNITY)
Admission: EM | Admit: 2022-12-26 | Discharge: 2022-12-26 | Disposition: A | Discharge status: Home / Self Care | Payer: Medicaid Other | Attending: Emergency Medicine | Admitting: Emergency Medicine

## 2022-12-26 DIAGNOSIS — S99911A Unspecified injury of right ankle, initial encounter: Secondary | ICD-10-CM | POA: Diagnosis present

## 2022-12-26 DIAGNOSIS — S93401A Sprain of unspecified ligament of right ankle, initial encounter: Secondary | ICD-10-CM | POA: Diagnosis not present

## 2022-12-26 MED ORDER — KETOROLAC TROMETHAMINE 15 MG/ML IJ SOLN
15.0000 mg | Freq: Once | INTRAMUSCULAR | Status: AC
  Administered 2022-12-26: 15 mg via INTRAMUSCULAR
  Filled 2022-12-26: qty 1

## 2022-12-26 MED ORDER — IBUPROFEN 600 MG PO TABS: 600.0000 mg | ORAL_TABLET | Freq: Four times a day (QID) | ORAL | 0 refills | Status: DC | PRN

## 2022-12-26 MED ORDER — OXYCODONE-ACETAMINOPHEN 5-325 MG PO TABS
1.0000 | ORAL_TABLET | Freq: Once | ORAL | Status: AC
  Administered 2022-12-26: 1 via ORAL
  Filled 2022-12-26: qty 1
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
## 2022-12-26 NOTE — Discharge Instructions (Addendum)
Please use Tylenol or ibuprofen for pain.  You may use 600 mg ibuprofen every 6 hours or 1000 mg of Tylenol every 6 hours.  You may choose to alternate between the 2.  This would be most effective.  Not to exceed 4 g of Tylenol within 24 hours.  Not to exceed 3200 mg ibuprofen 24 hours.  Recommend rest, ice, compression, elevation of the affected extremity.  Please follow-up with orthopedics in 1 to 2 weeks if you are not having significant improvement of your pain.  You can use the crutches and cam walker boot to help take pressure off the affected extremity until the pain and swelling starts to reside.  As tolerated you can start to try to put weight on it, since there is no underlying fracture you will not do any more damage something to try to bear weight as long as you do it slowly progressing as you are able.
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
## 2023-01-20 ENCOUNTER — Ambulatory Visit (INDEPENDENT_AMBULATORY_CARE_PROVIDER_SITE_OTHER): Payer: Medicaid Other

## 2023-01-20 VITALS — BP 121/84 | HR 85

## 2023-01-20 DIAGNOSIS — Z3042 Encounter for surveillance of injectable contraceptive: Secondary | ICD-10-CM | POA: Diagnosis not present

## 2023-01-20 MED ORDER — MEDROXYPROGESTERONE ACETATE 150 MG/ML IM SUSP
150.0000 mg | Freq: Once | INTRAMUSCULAR | Status: AC
  Administered 2023-01-20: 150 mg via INTRAMUSCULAR

## 2023-01-20 NOTE — Progress Notes (Signed)
Subjective:  Pt in for pt supply Depo Provera injection.    Objective: Need for contraception. No unusual complaints.  Pt is in a boot and on crutches unable to obtain weight today.   Assessment: Depo given L upper outer quadrant without difficulty  Plan:  Next injection due Aug 23-Sept 6.    She is ordered to report to jail for 4 months. Pt understands she will be a Depo restart when she returns in Oct aprx.

## 2023-05-25 ENCOUNTER — Other Ambulatory Visit: Payer: Self-pay | Admitting: Obstetrics

## 2023-05-25 DIAGNOSIS — Z3042 Encounter for surveillance of injectable contraceptive: Secondary | ICD-10-CM

## 2023-05-29 ENCOUNTER — Ambulatory Visit: Payer: MEDICAID

## 2023-05-29 ENCOUNTER — Other Ambulatory Visit (HOSPITAL_COMMUNITY)
Admission: RE | Admit: 2023-05-29 | Discharge: 2023-05-29 | Disposition: A | Discharge status: Home / Self Care | Payer: MEDICAID | Source: Ambulatory Visit | Attending: Obstetrics and Gynecology | Admitting: Obstetrics and Gynecology

## 2023-05-29 VITALS — BP 121/72 | HR 105 | Wt 166.0 lb

## 2023-05-29 DIAGNOSIS — Z113 Encounter for screening for infections with a predominantly sexual mode of transmission: Secondary | ICD-10-CM | POA: Diagnosis present

## 2023-05-29 DIAGNOSIS — Z3202 Encounter for pregnancy test, result negative: Secondary | ICD-10-CM

## 2023-05-29 DIAGNOSIS — Z3042 Encounter for surveillance of injectable contraceptive: Secondary | ICD-10-CM

## 2023-05-29 LAB — POCT URINE PREGNANCY: Preg Test, Ur: NEGATIVE

## 2023-05-29 MED ORDER — MEDROXYPROGESTERONE ACETATE 150 MG/ML IM SUSP
150.0000 mg | INTRAMUSCULAR | Status: AC
  Administered 2023-05-29: 150 mg via INTRAMUSCULAR
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
## 2023-05-30 LAB — CERVICOVAGINAL ANCILLARY ONLY
Bacterial Vaginitis (gardnerella): NEGATIVE
Candida Glabrata: NEGATIVE
Candida Vaginitis: NEGATIVE
Chlamydia: NEGATIVE
Comment: NEGATIVE
Comment: NEGATIVE
Comment: NEGATIVE
Comment: NEGATIVE
Comment: NEGATIVE
Comment: NORMAL
Neisseria Gonorrhea: NEGATIVE
Trichomonas: NEGATIVE

## 2023-05-30 LAB — RPR: RPR Ser Ql: NONREACTIVE

## 2023-05-30 LAB — HIV ANTIBODY (ROUTINE TESTING W REFLEX): HIV Screen 4th Generation wRfx: NONREACTIVE

## 2023-05-30 LAB — HEPATITIS B SURFACE ANTIGEN: Hepatitis B Surface Ag: NEGATIVE

## 2023-05-30 LAB — HEPATITIS C ANTIBODY: Hep C Virus Ab: NONREACTIVE

## 2023-07-10 ENCOUNTER — Other Ambulatory Visit: Payer: Self-pay

## 2023-07-10 ENCOUNTER — Encounter (HOSPITAL_BASED_OUTPATIENT_CLINIC_OR_DEPARTMENT_OTHER): Payer: Self-pay | Admitting: Emergency Medicine

## 2023-07-10 ENCOUNTER — Emergency Department (HOSPITAL_BASED_OUTPATIENT_CLINIC_OR_DEPARTMENT_OTHER)
Admission: EM | Admit: 2023-07-10 | Discharge: 2023-07-10 | Disposition: A | Discharge status: Home / Self Care | Payer: MEDICAID | Attending: Emergency Medicine | Admitting: Emergency Medicine

## 2023-07-10 DIAGNOSIS — Z20822 Contact with and (suspected) exposure to covid-19: Secondary | ICD-10-CM | POA: Diagnosis not present

## 2023-07-10 DIAGNOSIS — B9689 Other specified bacterial agents as the cause of diseases classified elsewhere: Secondary | ICD-10-CM | POA: Diagnosis not present

## 2023-07-10 DIAGNOSIS — J069 Acute upper respiratory infection, unspecified: Secondary | ICD-10-CM | POA: Insufficient documentation

## 2023-07-10 DIAGNOSIS — N760 Acute vaginitis: Secondary | ICD-10-CM | POA: Diagnosis not present

## 2023-07-10 DIAGNOSIS — R059 Cough, unspecified: Secondary | ICD-10-CM | POA: Diagnosis present

## 2023-07-10 LAB — WET PREP, GENITAL
Sperm: NONE SEEN
Trich, Wet Prep: NONE SEEN
WBC, Wet Prep HPF POC: >=10 — AB
Yeast Wet Prep HPF POC: NONE SEEN

## 2023-07-10 LAB — RESP PANEL BY RT-PCR (RSV, FLU A&B, COVID)  RVPGX2
Influenza A by PCR: NEGATIVE
Influenza B by PCR: NEGATIVE
Resp Syncytial Virus by PCR: NEGATIVE
SARS Coronavirus 2 by RT PCR: NEGATIVE

## 2023-07-10 MED ORDER — METRONIDAZOLE 500 MG PO TABS: 500.0000 mg | ORAL_TABLET | Freq: Two times a day (BID) | ORAL | 0 refills | Status: DC

## 2023-07-10 MED ORDER — AEROCHAMBER PLUS FLO-VU LARGE MISC
1.0000 | Freq: Once | Status: DC
  Filled 2023-07-10: qty 1

## 2023-07-10 MED ORDER — PREDNISONE 20 MG PO TABS: 40.0000 mg | ORAL_TABLET | Freq: Every day | ORAL | 0 refills | Status: AC

## 2023-07-10 MED ORDER — ALBUTEROL SULFATE HFA 108 (90 BASE) MCG/ACT IN AERS
1.0000 | INHALATION_SPRAY | Freq: Once | RESPIRATORY_TRACT | Status: AC
  Administered 2023-07-10: 1 via RESPIRATORY_TRACT
  Filled 2023-07-10: qty 6.7

## 2023-07-10 MED ORDER — BENZONATATE 100 MG PO CAPS: 100.0000 mg | ORAL_CAPSULE | Freq: Three times a day (TID) | ORAL | 0 refills | Status: DC | PRN

## 2023-07-10 NOTE — ED Triage Notes (Signed)
Pt reports fatigue, cough x 2 weeks. PT aox 4. Pt also reports concern that tampon stuck in vagina x 4 days

## 2023-07-10 NOTE — Discharge Instructions (Addendum)
As discussed, you tested negative for COVID, flu, RSV.  Your wet prep did test positive for BV; will treat this with Flagyl.  Follow MyChart for the results of your STD testing.  Suspect that your symptoms are likely secondary to viral URI. Recommend continue use of Tylenol/Motrin at home for treatment of pain as well as Cepacol throat lozenges or other sore throat drops.  Recommend beginning allergy medicine such as Claritin/Zyrtec/Allegra as well as nasal steroid spray such as Nasacort/Flonase.  Will also send in a cough suppressant to use as needed as well as short course of prednisone along with the inhaler to treat bronchitis..  Recommend follow-up with primary care for reassessment of your symptoms.  Please do not hesitate to return if the worrisome signs and symptoms we discussed to become apparent.

## 2023-07-10 NOTE — ED Provider Notes (Signed)
[place] EMERGENCY DEPARTMENT AT [facility] Provider Note   CSN: [id number] Arrival date & time: 07/10/23  1034     History  Chief Complaint  Patient presents with   Fatigue    Loretta Brewer is a 37 y.o. female.  HPI   37 year old female presents emergency department with complaints of nasal congestion, cough, generalized fatigue for the past week or so.  States that all of her kids have been ill at home with similar symptoms as well as her mother.  States that the family has been intermittently sick over the past 2 weeks or so.  Patient has been taking over-the-counter cough and cold medicine with some improvement of symptoms.  Patient also concerned because she feels like she has a tampon in her vagina that has been there for the past 4 days or so.  Denies any pelvic/vaginal pain, vaginal discharge.  Denies any fever, chills, abdominal pain, nausea and vomiting, shortness of breath.  Past medical history significant for OSA, anemia  Home Medications Prior to Admission medications   Medication Sig Start Date End Date Taking? Authorizing Provider  benzonatate (TESSALON) 100 MG capsule Take 1 capsule (100 mg total) by mouth 3 (three) times daily as needed. 07/10/23  Yes Sherian Maroon A, PA  metroNIDAZOLE (FLAGYL) 500 MG tablet Take 1 tablet (500 mg total) by mouth 2 (two) times daily. 07/10/23  Yes Sherian Maroon A, PA  predniSONE (DELTASONE) 20 MG tablet Take 2 tablets (40 mg total) by mouth daily with breakfast for 5 days. 07/10/23 07/15/23 Yes Sherian Maroon A, PA  ibuprofen (ADVIL) 600 MG tablet Take 1 tablet (600 mg total) by mouth every 6 (six) hours as needed. 12/26/22   Prosperi, Christian H, PA-C  medroxyPROGESTERone Acetate 150 MG/ML SUSY INJECT (150 MG TOTAL) INTO THE MUSCLE EVERY 3 MONTHS 05/26/23   Conan Bowens, MD  Multiple Vitamin (MULTIVITAMIN WITH MINERALS) TABS tablet Take 1 tablet by mouth daily.    [provider]  sertraline  (ZOLOFT) 50 MG tablet Take 1 tablet (50 mg total) by mouth daily. 10/13/22   Karie Fetch, MD  thiamine (VITAMIN B-1) 100 MG tablet Take 1 tablet (100 mg total) by mouth daily. 10/13/22   Karie Fetch, MD      Allergies    Patient has no known allergies.    Review of Systems   Review of Systems  All other systems reviewed and are negative.   Physical Exam Updated Vital Signs BP 138/70 (BP Location: Left Arm)   Pulse 81   Temp 98.6 F (37 C) (Oral)   Resp 18   Wt 74.8 kg   SpO2 100%   BMI 26.63 kg/m  Physical Exam Vitals and nursing note reviewed. Exam conducted with a chaperone present.  Constitutional:      General: She is not in acute distress.    Appearance: She is well-developed.  HENT:     Head: Normocephalic and atraumatic.     Right Ear: Tympanic membrane, ear canal and external ear normal.     Left Ear: Tympanic membrane, ear canal and external ear normal.     Nose: Congestion and rhinorrhea present.     Mouth/Throat:     Comments: Mild posterior pharyngeal erythema.  Uvula midline and symmetric with phonation.  No sublingual extremity but swelling.  Tonsils 1+ bilaterally without exudate.  No trismus. Eyes:     Conjunctiva/sclera: Conjunctivae normal.  Cardiovascular:     Rate and Rhythm: Normal rate and regular  rhythm.     Heart sounds: No murmur heard. Pulmonary:     Effort: Pulmonary effort is normal. No respiratory distress.     Breath sounds: No rales.     Comments: Faint expiratory wheeze appreciated bilateral lung fields middle and lower. Abdominal:     Palpations: Abdomen is soft.     Tenderness: There is no abdominal tenderness. There is no guarding.  Genitourinary:    General: Normal vulva.     Comments: Pelvic exam performed with female nurse chaperone at bedside.  Speculum inserted with mild amount of dark blood appreciated in vaginal vault.  Very faint white opaque discharge appreciated from cervix.  No obvious CMT.  No obvious lesion.  No  obvious foreign body. Musculoskeletal:        General: No swelling.     Cervical back: Neck supple.     Right lower leg: No edema.     Left lower leg: No edema.  Skin:    General: Skin is warm and dry.     Capillary Refill: Capillary refill takes less than 2 seconds.  Neurological:     Mental Status: She is alert.  Psychiatric:        Mood and Affect: Mood normal.     ED Results / Procedures / Treatments   Labs (all labs ordered are listed, but only abnormal results are displayed) Labs Reviewed  WET PREP, GENITAL - Abnormal; Notable for the following components:      Result Value   Clue Cells Wet Prep HPF POC PRESENT (*)    WBC, Wet Prep HPF POC >=10 (*)    All other components within normal limits  RESP PANEL BY RT-PCR (RSV, FLU A&B, COVID)  RVPGX2  GC/CHLAMYDIA PROBE AMP ([place]) NOT AT [facility]    EKG None  Radiology No results found.  Procedures Procedures    Medications Ordered in ED Medications  AeroChamber Plus Flo-Vu Large MISC 1 each (has no administration in time range)  albuterol (VENTOLIN HFA) 108 (90 Base) MCG/ACT inhaler 1 puff (1 puff Inhalation Given 07/10/23 1233)    ED Course/ Medical Decision Making/ A&P                                 Medical Decision Making Amount and/or Complexity of Data Reviewed Labs: ordered.  Risk Prescription drug management.   This patient presents to the ED for concern of congestion, cough, this involves an extensive number of treatment options, and is a complaint that carries with it a high risk of complications and morbidity.  The differential diagnosis includes RSV, COVID, influenza, pneumonia, sepsis, other   Co morbidities that complicate the patient evaluation  See HPI   Additional history obtained:  Additional history obtained from EMR External records from outside source obtained and reviewed including hospital records   Lab Tests:  I Ordered, and personally interpreted labs.  The pertinent  results include: Respiratory viral panel negative.  Wet prep positive for clue cells as well as WBC   Imaging Studies ordered:  N/a   Cardiac Monitoring: / EKG:  The patient was maintained on a cardiac monitor.  I personally viewed and interpreted the cardiac monitored which showed an underlying rhythm of: Sinus rhythm   Consultations Obtained:  N/a   Problem List / ED Course / Critical interventions / Medication management  Viral URI with cough, BV I ordered medication including albuterol   Reevaluation  of the patient after these medicines showed that the patient improved I have reviewed the patients home medicines and have made adjustments as needed   Social Determinants of Health:  Chronic cigarette use.  Denies illicit substance use.   Test / Admission - Considered:  Viral URI with cough, BV Vitals signs within normal range and stable throughout visit. Laboratory studies significant for: See above 37 year old female presents to the emergency department with complaints of cough, nasal congestion, generalized fatigue for the past week or so.  Symptoms have been intermittent since onset with multiple family numbers ill at home with similar symptoms.  On exam, patient without clinical evidence of pneumonia.  Respiratory viral panel obtained which was negative for RSV, COVID, flu.  Suspect the patient symptoms are likely secondary to viral URI.  Will recommend symptomatic therapy as described in AVS.  Patient was with slight wheeze on exam; given history of chronic cigarette use, will treat empirically with albuterol inhaler as well as short course of corticosteroids given concern for bronchitis versus potentially underlying COPD.  Regarding perceived retention of tampon, no evidence of foreign body during pelvic exam.  Vaginal bleeding as well as small amount of vaginal discharge was appreciated adequate wet prep positive for clue cells.  Patient symptoms could be secondary to  recurrent BV; will treat with metronidazole and have patient follow-up with primary care for reassessment.  Treatment plan discussed at length with patient and she acknowledged understanding was agreeable to said plan.  Patient overall well-appearing, afebrile in no acute distress. Worrisome signs and symptoms were discussed with the patient, and the patient acknowledged understanding to return to the ED if noticed. Patient was stable upon discharge.          Final Clinical Impression(s) / ED Diagnoses Final diagnoses:  Viral URI with cough  Bacterial vaginosis    Rx / DC Orders ED Discharge Orders          Ordered    benzonatate (TESSALON) 100 MG capsule  3 times daily PRN        07/10/23 1209    predniSONE (DELTASONE) 20 MG tablet  Daily with breakfast        07/10/23 1209    metroNIDAZOLE (FLAGYL) 500 MG tablet  2 times daily        07/10/23 1249              Peter Garter, [place] 07/10/23 1321    Gloris Manchester, MD 07/10/23 1559

## 2023-07-10 NOTE — ED Notes (Addendum)
Covid swab sent to lab.

## 2023-07-11 LAB — GC/CHLAMYDIA PROBE AMP (~~LOC~~) NOT AT ARMC
Chlamydia: NEGATIVE
Comment: NEGATIVE
Comment: NORMAL
Neisseria Gonorrhea: NEGATIVE

## 2023-08-14 ENCOUNTER — Ambulatory Visit: Payer: MEDICAID

## 2023-08-14 ENCOUNTER — Other Ambulatory Visit: Payer: Self-pay | Admitting: Obstetrics and Gynecology

## 2023-08-14 DIAGNOSIS — Z3042 Encounter for surveillance of injectable contraceptive: Secondary | ICD-10-CM

## 2023-09-26 ENCOUNTER — Ambulatory Visit: Payer: MEDICAID
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
## 2023-11-08 ENCOUNTER — Ambulatory Visit: Payer: MEDICAID | Admitting: Obstetrics and Gynecology

## 2023-12-04 ENCOUNTER — Other Ambulatory Visit: Payer: Self-pay

## 2023-12-04 ENCOUNTER — Emergency Department (HOSPITAL_COMMUNITY)
Admission: EM | Admit: 2023-12-04 | Discharge: 2023-12-05 | Disposition: A | Discharge status: Home / Self Care | Payer: MEDICAID | Attending: Emergency Medicine | Admitting: Emergency Medicine

## 2023-12-04 ENCOUNTER — Encounter (HOSPITAL_COMMUNITY): Payer: Self-pay | Admitting: Emergency Medicine

## 2023-12-04 DIAGNOSIS — R1013 Epigastric pain: Secondary | ICD-10-CM | POA: Diagnosis not present

## 2023-12-04 DIAGNOSIS — D72829 Elevated white blood cell count, unspecified: Secondary | ICD-10-CM | POA: Diagnosis not present

## 2023-12-04 DIAGNOSIS — R112 Nausea with vomiting, unspecified: Secondary | ICD-10-CM | POA: Diagnosis present

## 2023-12-04 DIAGNOSIS — R197 Diarrhea, unspecified: Secondary | ICD-10-CM | POA: Insufficient documentation

## 2023-12-04 LAB — CBC
HCT: 40.3 % (ref 36.0–46.0)
Hemoglobin: 13.4 g/dL (ref 12.0–15.0)
MCH: 27.7 pg (ref 26.0–34.0)
MCHC: 33.3 g/dL (ref 30.0–36.0)
MCV: 83.3 fL (ref 80.0–100.0)
Platelets: 286 10*3/uL (ref 150–400)
RBC: 4.84 MIL/uL (ref 3.87–5.11)
RDW: 14.1 % (ref 11.5–15.5)
WBC: 13.9 10*3/uL — ABNORMAL HIGH (ref 4.0–10.5)
nRBC: 0 % (ref 0.0–0.2)

## 2023-12-04 LAB — CBC WITH DIFFERENTIAL/PLATELET
Abs Immature Granulocytes: 0.05 10*3/uL (ref 0.00–0.07)
Basophils Absolute: 0 10*3/uL (ref 0.0–0.1)
Basophils Relative: 0 %
Eosinophils Absolute: 0 10*3/uL (ref 0.0–0.5)
Eosinophils Relative: 0 %
HCT: 40.2 % (ref 36.0–46.0)
Hemoglobin: 13.3 g/dL (ref 12.0–15.0)
Immature Granulocytes: 0 %
Lymphocytes Relative: 13 %
Lymphs Abs: 1.5 10*3/uL (ref 0.7–4.0)
MCH: 27.8 pg (ref 26.0–34.0)
MCHC: 33.1 g/dL (ref 30.0–36.0)
MCV: 83.9 fL (ref 80.0–100.0)
Monocytes Absolute: 1.1 10*3/uL — ABNORMAL HIGH (ref 0.1–1.0)
Monocytes Relative: 9 %
Neutro Abs: 9.2 10*3/uL — ABNORMAL HIGH (ref 1.7–7.7)
Neutrophils Relative %: 78 %
Platelets: 266 10*3/uL (ref 150–400)
RBC: 4.79 MIL/uL (ref 3.87–5.11)
RDW: 14.3 % (ref 11.5–15.5)
WBC: 11.9 10*3/uL — ABNORMAL HIGH (ref 4.0–10.5)
nRBC: 0 % (ref 0.0–0.2)

## 2023-12-04 LAB — COMPREHENSIVE METABOLIC PANEL WITH GFR
ALT: 21 U/L (ref 0–44)
AST: 48 U/L — ABNORMAL HIGH (ref 15–41)
Albumin: 4.9 g/dL (ref 3.5–5.0)
Alkaline Phosphatase: 90 U/L (ref 38–126)
Anion gap: 18 — ABNORMAL HIGH (ref 5–15)
BUN: 16 mg/dL (ref 6–20)
CO2: 22 mmol/L (ref 22–32)
Calcium: 9 mg/dL (ref 8.9–10.3)
Chloride: 102 mmol/L (ref 98–111)
Creatinine, Ser: 0.66 mg/dL (ref 0.44–1.00)
GFR, Estimated: >60 mL/min (ref >60)
Glucose, Bld: 85 mg/dL (ref 70–99)
Potassium: 4.1 mmol/L (ref 3.5–5.1)
Sodium: 142 mmol/L (ref 135–145)
Total Bilirubin: 1.8 mg/dL — ABNORMAL HIGH (ref 0.0–1.2)
Total Protein: 8.8 g/dL — ABNORMAL HIGH (ref 6.5–8.1)

## 2023-12-04 LAB — HCG, SERUM, QUALITATIVE: Preg, Serum: NEGATIVE

## 2023-12-04 LAB — LIPASE, BLOOD: Lipase: 28 U/L (ref 11–51)

## 2023-12-04 LAB — ETHANOL: Alcohol, Ethyl (B): <10 mg/dL (ref <10)

## 2023-12-04 MED ORDER — PANTOPRAZOLE SODIUM 40 MG IV SOLR
40.0000 mg | Freq: Once | INTRAVENOUS | Status: AC
  Administered 2023-12-04: 40 mg via INTRAVENOUS
  Filled 2023-12-04: qty 10

## 2023-12-04 MED ORDER — LACTATED RINGERS IV BOLUS
1000.0000 mL | Freq: Once | INTRAVENOUS | Status: AC
  Administered 2023-12-04: 1000 mL via INTRAVENOUS

## 2023-12-04 MED ORDER — ONDANSETRON HCL 4 MG/2ML IJ SOLN
4.0000 mg | Freq: Once | INTRAMUSCULAR | Status: AC
  Administered 2023-12-04: 4 mg via INTRAVENOUS
  Filled 2023-12-04: qty 2

## 2023-12-04 NOTE — ED Provider Notes (Signed)
  EMERGENCY DEPARTMENT AT [facility] Provider Note   CSN: [id number] Arrival date & time: 12/04/23  1618     History {Add pertinent medical, surgical, social history, OB history to HPI:1} Chief Complaint  Patient presents with   Emesis    Loretta Brewer is a 38 y.o. female.  Patient denies any medical history.  She is here with nausea vomiting and diarrhea that onset today.  Admits to drinking alcohol heavily over the weekend into this morning.  Around noontime today she will of epigastric abdominal pain associate with nausea and vomiting.  Believes she vomited 15-16 times with was clear with some intermittent streaks of red.  Did have several episodes of diarrhea as well.  Has diffuse crampy upper abdominal pain that is fairly constant.  Denies any chronic medical conditions and does not drink alcohol on an every day basis.  Does have history of acid reflux.  Still has appendix and gallbladder.  No fever.  No pain with urination or blood in the urine.  While she was waiting in the waiting room she began to feel "panicked" and developed some numbness and tingling to her bilateral arms and legs and chest tightness.  This is now resolved.  Estimates she has had this tightness in her chest before 5 times that she has been in the ED.  No chest pain currently.  Has epigastric pain.  Continues to have nausea.  Denies any other drug use.  The history is provided by the patient.  Emesis Associated symptoms: abdominal pain and diarrhea   Associated symptoms: no arthralgias, no cough, no fever, no headaches and no myalgias        Home Medications Prior to Admission medications   Medication Sig Start Date End Date Taking? Authorizing Provider  benzonatate  (TESSALON ) 100 MG capsule Take 1 capsule (100 mg total) by mouth 3 (three) times daily as needed. 07/10/23   [place] Butter, PA  ibuprofen  (ADVIL ) 600 MG tablet Take 1 tablet (600 mg total) by mouth every 6 (six) hours  as needed. 12/26/22   Prosperi, Christian H, PA-C  medroxyPROGESTERone  Acetate 150 MG/ML SUSY INJECT 1 ML INTO THE MUSCLE EVERY 3 MONTHS 08/14/23   [place] Layer, MD  metroNIDAZOLE  (FLAGYL ) 500 MG tablet Take 1 tablet (500 mg total) by mouth 2 (two) times daily. 07/10/23   [place] Butter, PA  Multiple Vitamin (MULTIVITAMIN WITH MINERALS) TABS tablet Take 1 tablet by mouth daily.    [provider]  sertraline  (ZOLOFT ) 50 MG tablet Take 1 tablet (50 mg total) by mouth daily. 10/13/22   Chien, Stephanie, MD  thiamine  (VITAMIN B-1) 100 MG tablet Take 1 tablet (100 mg total) by mouth daily. 10/13/22   Chien, Stephanie, MD      Allergies    Patient has no known allergies.    Review of Systems   Review of Systems  Constitutional:  Positive for activity change and appetite change. Negative for fever.  HENT:  Negative for congestion and rhinorrhea.   Respiratory:  Negative for cough.   Cardiovascular:  Positive for chest pain.  Gastrointestinal:  Positive for abdominal pain, diarrhea, nausea and vomiting.  Genitourinary:  Negative for dysuria and hematuria.  Musculoskeletal:  Negative for arthralgias and myalgias.  Skin:  Negative for rash.  Neurological:  Negative for dizziness, weakness and headaches.   all other systems are negative except as noted in the HPI and PMH.    Physical Exam Updated Vital Signs BP 112/82  Pulse (!) 121   Temp 98 F (36.7 C) (Oral)   Resp 18   SpO2 100%  Physical Exam Vitals and nursing note reviewed.  Constitutional:      General: She is not in acute distress.    Appearance: She is well-developed.  HENT:     Head: Normocephalic and atraumatic.     Mouth/Throat:     Pharynx: No oropharyngeal exudate.  Eyes:     Conjunctiva/sclera: Conjunctivae normal.     Pupils: Pupils are equal, round, and reactive to light.  Neck:     Comments: No meningismus. Cardiovascular:     Rate and Rhythm: Regular rhythm. Tachycardia present.     Heart sounds:  Normal heart sounds. No murmur heard. Pulmonary:     Effort: Pulmonary effort is normal. No respiratory distress.     Breath sounds: Normal breath sounds.  Abdominal:     Palpations: Abdomen is soft.     Tenderness: There is abdominal tenderness. There is no guarding or rebound.     Comments: Epigastric tenderness, no guarding or rebound  Musculoskeletal:        General: No tenderness. Normal range of motion.     Cervical back: Normal range of motion and neck supple.  Skin:    General: Skin is warm.  Neurological:     Mental Status: She is alert and oriented to person, place, and time.     Cranial Nerves: No cranial nerve deficit.     Motor: No abnormal muscle tone.     Coordination: Coordination normal.     Comments:  5/5 strength throughout. CN 2-12 intact.Equal grip strength.   Psychiatric:        Behavior: Behavior normal.     ED Results / Procedures / Treatments   Labs (all labs ordered are listed, but only abnormal results are displayed) Labs Reviewed  COMPREHENSIVE METABOLIC PANEL WITH GFR - Abnormal; Notable for the following components:      Result Value   Total Protein 8.8 (*)    AST 48 (*)    Total Bilirubin 1.8 (*)    Anion gap 18 (*)    All other components within normal limits  CBC - Abnormal; Notable for the following components:   WBC 13.9 (*)    All other components within normal limits  CBC WITH DIFFERENTIAL/PLATELET - Abnormal; Notable for the following components:   WBC 11.9 (*)    Neutro Abs 9.2 (*)    Monocytes Absolute 1.1 (*)    All other components within normal limits  LIPASE, BLOOD  HCG, SERUM, QUALITATIVE  ETHANOL  URINALYSIS, ROUTINE W REFLEX MICROSCOPIC  D-DIMER, QUANTITATIVE  TROPONIN I (HIGH SENSITIVITY)    EKG EKG Interpretation Date/Time:  Monday December 04 2023 23:15:13 EDT Ventricular Rate:  90 PR Interval:  90 QRS Duration:  86 QT Interval:  398 QTC Calculation: 487 R Axis:   35  Text Interpretation: Sinus rhythm Short PR  interval Right atrial enlargement RSR' in V1 or V2, probably normal variant Left ventricular hypertrophy Borderline prolonged QT interval Nonspecific T wave abnormality Confirmed by Earma Gloss [phone]) on 12/04/2023 11:31:27 PM  Radiology No results found.  Procedures Procedures  {Document cardiac monitor, telemetry assessment procedure when appropriate:1}  Medications Ordered in ED Medications  lactated ringers  bolus 1,000 mL (has no administration in time range)  ondansetron  (ZOFRAN ) injection 4 mg (has no administration in time range)  pantoprazole  (PROTONIX ) injection 40 mg (has no administration in time range)  ED Course/ Medical Decision Making/ A&P   {   Click here for ABCD2, HEART and other calculatorsREFRESH Note before signing :1}                              Medical Decision Making Amount and/or Complexity of Data Reviewed Labs: ordered. Decision-making details documented in ED Course. Radiology: ordered and independent interpretation performed. Decision-making details documented in ED Course. ECG/medicine tests: ordered and independent interpretation performed. Decision-making details documented in ED Course.  Risk Prescription drug management.   Epigastric pain with nausea and vomiting in the setting of IV alcohol intake.  She is tachycardic with stable vital signs otherwise.  Abdomen soft without peritoneal signs.  EKG is sinus rhythm without acute ST changes.  Hydrate and treat symptoms.  Labs are reassuring.  Labs show leukocytosis of 14.  LFTs and lipase normal. T wave inversion lead III similar to previous  Labs with transaminitis.  Lipase is normal.  Leukocytosis noted.  {Document critical care time when appropriate:1} {Document review of labs and clinical decision tools ie heart score, Chads2Vasc2 etc:1}  {Document your independent review of radiology images, and any outside records:1} {Document your discussion with family members, caretakers, and  with consultants:1} {Document social determinants of health affecting pt's care:1} {Document your decision making why or why not admission, treatments were needed:1} Final Clinical Impression(s) / ED Diagnoses Final diagnoses:  None    Rx / DC Orders ED Discharge Orders     None

## 2023-12-04 NOTE — ED Notes (Signed)
 Patient states numbness, tingling and nausea has started while she is in the waiting room. Pt says that it is an on and off thing and she is okay for now.

## 2023-12-04 NOTE — ED Triage Notes (Signed)
 Pt BIB [facility] with reports of ETOH use and N/V/D. Pt reports drinking from Saturday until 0200 this morning.   BP 142/84 CBG 111 P 104 O2 98%

## 2023-12-04 NOTE — ED Provider Triage Note (Addendum)
 Emergency Medicine Provider Triage Evaluation Note  Loretta Brewer , a 38 y.o. female  was evaluated in triage.  Pt complains of nausea and vomiting starting this morning after reporting drinking from Saturday through Sunday, unknown amount of consumption. Hx of alcohol abuse.  Reports initially having some epigastric abdominal pain that was intermittent and accompanied with vomiting. Last episode of emesis was 2 hours ago. Reports a "speck of blood" in vomit. Came in today due to feeling "panicked" and having some chest tightness and numb fingers. Currently hungry and thirsty.   Denies fever, shortness of breath, chest pain, current abdominal pain, melena, hematochezia, hematuria, numbness/tingling, tremors.   Review of Systems  Positive: N/a Negative: N/a  Physical Exam  BP (!) 134/92 (BP Location: Left Arm)   Pulse 95   Temp 98.1 F (36.7 C) (Oral)   Resp 16   SpO2 100%  Gen:   Awake, no distress   Resp:  Normal effort  MSK:   Moves extremities without difficulty  Other:  No tongue fasciculations   Medical Decision Making  Medically screening exam initiated at 5:33 PM.  Appropriate orders placed.  Loretta Brewer was informed that the remainder of the evaluation will be completed by another provider, this initial triage assessment does not replace that evaluation, and the importance of remaining in the ED until their evaluation is complete.     Hayes Lipps, PA-C 12/04/23 1737    Hayes Lipps, [place] 12/04/23 [phone]

## 2023-12-05 ENCOUNTER — Emergency Department (HOSPITAL_COMMUNITY): Payer: MEDICAID

## 2023-12-05 ENCOUNTER — Encounter (HOSPITAL_COMMUNITY): Payer: Self-pay

## 2023-12-05 LAB — URINALYSIS, ROUTINE W REFLEX MICROSCOPIC
Bacteria, UA: NONE SEEN
Bilirubin Urine: NEGATIVE
Glucose, UA: NEGATIVE mg/dL
Ketones, ur: 80 mg/dL — AB
Leukocytes,Ua: NEGATIVE
Nitrite: NEGATIVE
Protein, ur: >=300 mg/dL — AB
Specific Gravity, Urine: 1.034 — ABNORMAL HIGH (ref 1.005–1.030)
pH: 5 (ref 5.0–8.0)

## 2023-12-05 LAB — LACTIC ACID, PLASMA: Lactic Acid, Venous: 1.1 mmol/L (ref 0.5–1.9)

## 2023-12-05 LAB — TROPONIN I (HIGH SENSITIVITY)
Troponin I (High Sensitivity): 3 ng/L (ref <18)
Troponin I (High Sensitivity): 4 ng/L (ref <18)

## 2023-12-05 LAB — D-DIMER, QUANTITATIVE: D-Dimer, Quant: 0.49 ug{FEU}/mL (ref 0.00–0.50)

## 2023-12-05 MED ORDER — IOHEXOL 300 MG/ML  SOLN
100.0000 mL | Freq: Once | INTRAMUSCULAR | Status: AC | PRN
  Administered 2023-12-05: 100 mL via INTRAVENOUS

## 2023-12-05 MED ORDER — ONDANSETRON 4 MG PO TBDP: 4.0000 mg | ORAL_TABLET | Freq: Three times a day (TID) | ORAL | 0 refills | Status: DC | PRN

## 2023-12-05 MED ORDER — LACTATED RINGERS IV BOLUS
1000.0000 mL | Freq: Once | INTRAVENOUS | Status: AC
  Administered 2023-12-05: 1000 mL via INTRAVENOUS

## 2023-12-05 MED ORDER — OMEPRAZOLE 20 MG PO CPDR: 20.0000 mg | DELAYED_RELEASE_CAPSULE | Freq: Every day | ORAL | 0 refills | Status: DC

## 2023-12-05 MED ORDER — SODIUM CHLORIDE (PF) 0.9 % IJ SOLN
INTRAMUSCULAR | Status: AC
  Filled 2023-12-05: qty 50

## 2023-12-05 NOTE — ED Provider Notes (Signed)
  Physical Exam  BP 111/69   Pulse 100   Temp 98.4 F (36.9 C)   Resp 17   SpO2 98%   Physical Exam  Procedures  Procedures  ED Course / MDM   Clinical Course as of 12/05/23 0954  Tue Dec 05, 2023  0714 Assumed care from Dr Alison Irvine.  38 yo F with suspected alcoholic gastritis. Given fluids and feeling better. Awaiting CT scan.  [RP]  [phone] CT shows possible ileitis as well as gallstones or small amount of sludge without cholecystitis. [RP]  [phone] Patient able to tolerate p.o. without difficulty. [RP]  [phone] Patient reassessed.  Overall well-appearing.  Discussed CT results which show stones versus sludge with possible ileitis.  Suspect this is likely viral and does not need antibiotics at this time.  Will have her follow-up with her primary doctor in several days.  Return precautions discussed prior to discharge.  She had antacids and nausea medication sent to her pharmacy. [RP]    Clinical Course User Index [RP] Ninetta Basket, MD   Medical Decision Making Amount and/or Complexity of Data Reviewed Labs: ordered. Radiology: ordered.  Risk Prescription drug management.      Ninetta Basket, MD 12/05/23 [phone]

## 2023-12-05 NOTE — Discharge Instructions (Addendum)
 Your testing is reassuring.  Start with a clear liquid diet and advance slowly as tolerated.  Reduce your alcohol intake.  Follow-up with your primary doctor.  Return to the ED with worsening symptoms.

## 2023-12-05 NOTE — ED Notes (Signed)
Patient tolerated P.O. Challenge. Patient denies N/V.

## 2024-07-14 ENCOUNTER — Inpatient Hospital Stay (HOSPITAL_COMMUNITY)
Admission: EM | Admit: 2024-07-14 | Discharge: 2024-07-16 | DRG: 897 | Disposition: A | Payer: MEDICAID | Attending: Student | Admitting: Student

## 2024-07-14 ENCOUNTER — Encounter (HOSPITAL_COMMUNITY): Payer: Self-pay | Admitting: Student

## 2024-07-14 ENCOUNTER — Other Ambulatory Visit: Payer: Self-pay

## 2024-07-14 DIAGNOSIS — F1090 Alcohol use, unspecified, uncomplicated: Secondary | ICD-10-CM | POA: Diagnosis present

## 2024-07-14 DIAGNOSIS — F419 Anxiety disorder, unspecified: Secondary | ICD-10-CM | POA: Insufficient documentation

## 2024-07-14 DIAGNOSIS — F1990 Other psychoactive substance use, unspecified, uncomplicated: Secondary | ICD-10-CM

## 2024-07-14 DIAGNOSIS — R112 Nausea with vomiting, unspecified: Secondary | ICD-10-CM | POA: Diagnosis present

## 2024-07-14 DIAGNOSIS — R9431 Abnormal electrocardiogram [ECG] [EKG]: Secondary | ICD-10-CM

## 2024-07-14 DIAGNOSIS — Z720 Tobacco use: Secondary | ICD-10-CM | POA: Insufficient documentation

## 2024-07-14 DIAGNOSIS — R748 Abnormal levels of other serum enzymes: Secondary | ICD-10-CM | POA: Insufficient documentation

## 2024-07-14 DIAGNOSIS — E8729 Other acidosis: Principal | ICD-10-CM

## 2024-07-14 LAB — CBC WITH DIFFERENTIAL/PLATELET
Abs Immature Granulocytes: 0.03 K/uL (ref 0.00–0.07)
Basophils Absolute: 0 K/uL (ref 0.0–0.1)
Basophils Relative: 0 %
Eosinophils Absolute: 0.1 K/uL (ref 0.0–0.5)
Eosinophils Relative: 1 %
HCT: 38.7 % (ref 36.0–46.0)
Hemoglobin: 12.9 g/dL (ref 12.0–15.0)
Immature Granulocytes: 0 %
Lymphocytes Relative: 22 %
Lymphs Abs: 1.8 K/uL (ref 0.7–4.0)
MCH: 26.9 pg (ref 26.0–34.0)
MCHC: 33.3 g/dL (ref 30.0–36.0)
MCV: 80.8 fL (ref 80.0–100.0)
Monocytes Absolute: 0.7 K/uL (ref 0.1–1.0)
Monocytes Relative: 8 %
Neutro Abs: 5.4 K/uL (ref 1.7–7.7)
Neutrophils Relative %: 69 %
Platelets: 270 K/uL (ref 150–400)
RBC: 4.79 MIL/uL (ref 3.87–5.11)
RDW: 14.2 % (ref 11.5–15.5)
WBC: 7.9 K/uL (ref 4.0–10.5)
nRBC: 0 % (ref 0.0–0.2)

## 2024-07-14 LAB — URINALYSIS, ROUTINE W REFLEX MICROSCOPIC
Bilirubin Urine: NEGATIVE
Glucose, UA: NEGATIVE mg/dL
Hgb urine dipstick: NEGATIVE
Ketones, ur: 80 mg/dL — AB
Leukocytes,Ua: NEGATIVE
Nitrite: NEGATIVE
Protein, ur: >=300 mg/dL — AB
Specific Gravity, Urine: 1.028 (ref 1.005–1.030)
pH: 5 (ref 5.0–8.0)

## 2024-07-14 LAB — COMPREHENSIVE METABOLIC PANEL WITH GFR
ALT: 61 U/L — ABNORMAL HIGH (ref 0–44)
AST: 179 U/L — ABNORMAL HIGH (ref 15–41)
Albumin: 4.3 g/dL (ref 3.5–5.0)
Alkaline Phosphatase: 83 U/L (ref 38–126)
Anion gap: 17 — ABNORMAL HIGH (ref 5–15)
BUN: 10 mg/dL (ref 6–20)
CO2: 26 mmol/L (ref 22–32)
Calcium: 9.2 mg/dL (ref 8.9–10.3)
Chloride: 99 mmol/L (ref 98–111)
Creatinine, Ser: 0.58 mg/dL (ref 0.44–1.00)
GFR, Estimated: >60 mL/min (ref >60)
Glucose, Bld: 73 mg/dL (ref 70–99)
Potassium: 4.4 mmol/L (ref 3.5–5.1)
Sodium: 142 mmol/L (ref 135–145)
Total Bilirubin: 0.8 mg/dL (ref 0.0–1.2)
Total Protein: 7.2 g/dL (ref 6.5–8.1)

## 2024-07-14 LAB — ETHANOL: Alcohol, Ethyl (B): <15 mg/dL (ref <15)

## 2024-07-14 LAB — HCG, SERUM, QUALITATIVE: Preg, Serum: NEGATIVE

## 2024-07-14 LAB — MAGNESIUM: Magnesium: 2 mg/dL (ref 1.7–2.4)

## 2024-07-14 MED ORDER — ENOXAPARIN SODIUM 40 MG/0.4ML IJ SOSY
40.0000 mg | PREFILLED_SYRINGE | INTRAMUSCULAR | Status: DC
  Administered 2024-07-15: 40 mg via SUBCUTANEOUS
  Filled 2024-07-14: qty 0.4

## 2024-07-14 MED ORDER — ACETAMINOPHEN 650 MG RE SUPP: 650.0000 mg | Freq: Four times a day (QID) | RECTAL | Status: DC | PRN

## 2024-07-14 MED ORDER — ADULT MULTIVITAMIN W/MINERALS CH
1.0000 | ORAL_TABLET | Freq: Every day | ORAL | Status: DC
  Administered 2024-07-15 – 2024-07-16 (×2): 1 via ORAL
  Filled 2024-07-14 (×2): qty 1

## 2024-07-14 MED ORDER — LACTATED RINGERS IV BOLUS
1000.0000 mL | Freq: Once | INTRAVENOUS | Status: AC
  Administered 2024-07-14: 1000 mL via INTRAVENOUS

## 2024-07-14 MED ORDER — ACETAMINOPHEN 325 MG PO TABS: 650.0000 mg | ORAL_TABLET | Freq: Four times a day (QID) | ORAL | Status: DC | PRN

## 2024-07-14 MED ORDER — PANTOPRAZOLE SODIUM 40 MG IV SOLR
40.0000 mg | Freq: Once | INTRAVENOUS | Status: AC
  Administered 2024-07-14: 40 mg via INTRAVENOUS
  Filled 2024-07-14: qty 10

## 2024-07-14 MED ORDER — THIAMINE HCL 100 MG/ML IJ SOLN: 100.0000 mg | Freq: Once | INTRAMUSCULAR | Status: DC

## 2024-07-14 MED ORDER — SENNOSIDES-DOCUSATE SODIUM 8.6-50 MG PO TABS: 1.0000 | ORAL_TABLET | Freq: Every evening | ORAL | Status: DC | PRN

## 2024-07-14 MED ORDER — ONDANSETRON HCL 4 MG/2ML IJ SOLN
4.0000 mg | Freq: Once | INTRAMUSCULAR | Status: AC
  Administered 2024-07-14: 4 mg via INTRAVENOUS
  Filled 2024-07-14: qty 2

## 2024-07-14 MED ORDER — THIAMINE MONONITRATE 100 MG PO TABS
100.0000 mg | ORAL_TABLET | Freq: Every day | ORAL | Status: DC
  Administered 2024-07-15 – 2024-07-16 (×2): 100 mg via ORAL
  Filled 2024-07-14 (×2): qty 1

## 2024-07-14 MED ORDER — ONDANSETRON HCL 4 MG/2ML IJ SOLN: 4.0000 mg | Freq: Four times a day (QID) | INTRAMUSCULAR | Status: DC | PRN

## 2024-07-14 MED ORDER — LORAZEPAM 1 MG PO TABS
1.0000 mg | ORAL_TABLET | ORAL | Status: DC | PRN
  Administered 2024-07-15 (×2): 1 mg via ORAL
  Filled 2024-07-14: qty 2
  Filled 2024-07-14: qty 1

## 2024-07-14 MED ORDER — LACTATED RINGERS IV SOLN: INTRAVENOUS | Status: DC

## 2024-07-14 MED ORDER — LORAZEPAM 2 MG/ML IJ SOLN: 1.0000 mg | INTRAMUSCULAR | Status: DC | PRN

## 2024-07-14 MED ORDER — FOLIC ACID 1 MG PO TABS
1.0000 mg | ORAL_TABLET | Freq: Every day | ORAL | Status: DC
  Administered 2024-07-15 – 2024-07-16 (×2): 1 mg via ORAL
  Filled 2024-07-14 (×2): qty 1

## 2024-07-14 MED ORDER — TRIMETHOBENZAMIDE HCL 100 MG/ML IM SOLN: 200.0000 mg | Freq: Four times a day (QID) | INTRAMUSCULAR | Status: DC | PRN

## 2024-07-14 MED ORDER — ONDANSETRON HCL 4 MG PO TABS: 4.0000 mg | ORAL_TABLET | Freq: Four times a day (QID) | ORAL | Status: DC | PRN

## 2024-07-14 MED ORDER — THIAMINE HCL 100 MG/ML IJ SOLN: 100.0000 mg | Freq: Every day | INTRAMUSCULAR | Status: DC

## 2024-07-14 MED ORDER — BISACODYL 5 MG PO TBEC: 5.0000 mg | DELAYED_RELEASE_TABLET | Freq: Every day | ORAL | Status: DC | PRN

## 2024-07-14 NOTE — ED Provider Notes (Signed)
 [place] EMERGENCY DEPARTMENT AT [facility] Provider Note   CSN: [id number] Arrival date & time: 07/14/24  1845     Patient presents with: Emesis and Alcohol Intoxication   Loretta Brewer is a 38 y.o. female.   HPI 38 year old female presents with vomiting and alcohol abuse.  She states that she chronically abuses alcohol but over the last 2-3 days she has been drinking heavier and then this afternoon started having multiple episodes of emesis.  After several bouts of emesis she has noticed small streaks of blood as well which got her concerned and sent her to the ER.  Had a couple loose stools this morning but otherwise no significant diarrhea.  No abdominal pain.  She has also been having episodes of her muscles locking up and this caused her to fall out of the wheelchair when she was in triage without any significant injury.  Prior to Admission medications   Medication Sig Start Date End Date Taking? Authorizing Provider  benzonatate  (TESSALON ) 100 MG capsule Take 1 capsule (100 mg total) by mouth 3 (three) times daily as needed. Patient not taking: Reported on 12/05/2023 07/10/23   [place] A, PA  ibuprofen  (ADVIL ) 600 MG tablet Take 1 tablet (600 mg total) by mouth every 6 (six) hours as needed. 12/26/22   Prosperi, Christian H, PA-C  medroxyPROGESTERone  Acetate 150 MG/ML SUSY INJECT 1 ML INTO THE MUSCLE EVERY 3 MONTHS Patient not taking: Reported on 12/05/2023 08/14/23   Fredirick Glenys RAMAN, MD  metroNIDAZOLE  (FLAGYL ) 500 MG tablet Take 1 tablet (500 mg total) by mouth 2 (two) times daily. Patient not taking: Reported on 12/05/2023 07/10/23   [place] LABOR, PA  Multiple Vitamin (MULTIVITAMIN WITH MINERALS) TABS tablet Take 1 tablet by mouth daily. Patient not taking: Reported on 12/05/2023    [provider]  naproxen (NAPROSYN) 500 MG tablet Take 1 tablet by mouth 2 (two) times daily as needed. Patient not taking: Reported on 12/05/2023    [provider]  omeprazole  (PRILOSEC) 20 MG capsule Take 1 capsule (20 mg total) by mouth daily. 12/05/23   Rancour, Garnette, MD  ondansetron  (ZOFRAN -ODT) 4 MG disintegrating tablet Take 1 tablet (4 mg total) by mouth every 8 (eight) hours as needed for nausea or vomiting. 12/05/23   Rancour, Garnette, MD  oxyCODONE  (OXY IR/ROXICODONE ) 5 MG immediate release tablet Take 5 mg by mouth every 6 (six) hours as needed. Patient not taking: Reported on 12/05/2023 09/27/21   [provider]  sertraline  (ZOLOFT ) 50 MG tablet Take 1 tablet (50 mg total) by mouth daily. Patient not taking: Reported on 12/05/2023 10/13/22   Graham Krabbe, MD  thiamine  (VITAMIN B-1) 100 MG tablet Take 1 tablet (100 mg total) by mouth daily. Patient not taking: Reported on 12/05/2023 10/13/22   Chien, Stephanie, MD    Allergies: Patient has no known allergies.    Review of Systems  Respiratory:  Negative for shortness of breath.   Cardiovascular:  Negative for chest pain.  Gastrointestinal:  Positive for nausea and vomiting. Negative for abdominal pain.    Updated Vital Signs BP (!) 134/95   Pulse (!) 117   Temp 98.1 F (36.7 C) (Oral)   Resp (!) 22   LMP 06/28/2024 (Exact Date)   SpO2 100%   Physical Exam Vitals and nursing note reviewed.  Constitutional:      Appearance: She is well-developed. She is not diaphoretic.  HENT:     Head: Normocephalic and atraumatic.  Cardiovascular:  Rate and Rhythm: Regular rhythm. Tachycardia present.     Heart sounds: Normal heart sounds.  Pulmonary:     Effort: Pulmonary effort is normal.     Breath sounds: Normal breath sounds.  Abdominal:     General: There is no distension.     Palpations: Abdomen is soft.     Tenderness: There is no abdominal tenderness.  Skin:    General: Skin is warm and dry.  Neurological:     Mental Status: She is alert.     (all labs ordered are listed, but only abnormal results are displayed) Labs Reviewed  URINALYSIS,  ROUTINE W REFLEX MICROSCOPIC - Abnormal; Notable for the following components:      Result Value   Color, Urine AMBER (*)    APPearance HAZY (*)    Ketones, ur 80 (*)    Protein, ur >=300 (*)    Bacteria, UA RARE (*)    All other components within normal limits  COMPREHENSIVE METABOLIC PANEL WITH GFR - Abnormal; Notable for the following components:   AST 179 (*)    ALT 61 (*)    Anion gap 17 (*)    All other components within normal limits  CBC WITH DIFFERENTIAL/PLATELET  MAGNESIUM   HCG, SERUM, QUALITATIVE  ETHANOL  I-STAT CHEM 8, ED    EKG: EKG Interpretation Date/Time:  Sunday July 14 2024 18:51:56 EST Ventricular Rate:  127 PR Interval:  90 QRS Duration:  83 QT Interval:  373 QTC Calculation: 543 R Axis:   12  Text Interpretation: Sinus tachycardia LAE, consider biatrial enlargement RSR' in V1 or V2, right VCD or RVH Prolonged QT interval Confirmed by Freddi Hamilton [phone]) on 07/14/2024 7:01:05 PM  Radiology: No results found.   Procedures   Medications Ordered in the ED  thiamine  (VITAMIN B1) injection 100 mg (has no administration in time range)  lactated ringers  bolus 1,000 mL (1,000 mLs Intravenous New Bag/Given 07/14/24 1949)  lactated ringers  bolus 1,000 mL (0 mLs Intravenous Stopped 07/14/24 2119)  ondansetron  (ZOFRAN ) injection 4 mg (4 mg Intravenous Given 07/14/24 1948)  ondansetron  (ZOFRAN ) injection 4 mg (4 mg Intravenous Given 07/14/24 2150)                                    Medical Decision Making Amount and/or Complexity of Data Reviewed External Data Reviewed: notes. Labs: ordered.    Details: Anion gap acidosis. ECG/medicine tests: ordered and independent interpretation performed.    Details: Sinus tachycardia  Risk Prescription drug management. Decision regarding hospitalization.   Patient was given IV fluids and Zofran .  Zofran  temporarily help but then she started having vomiting again.  Now still vomiting despite another  dose of Zofran .  She appears to have an anion gap acidosis and large amount of ketones in her urine.  I suspect this is alcoholic ketoacidosis.  Given a dose of IV thiamine .  EtOH is negative and further clarifying with patient, she last drank alcohol yesterday but is having the vomiting today.  Will get a CIWA, she states she does not drink every day but drinks often.  She will need to be admitted for further hydration and closing of her gap as well as monitoring for alcohol withdrawal.  Discussed with Dr. Lou.  Has a benign abdominal exam, I do not think imaging is needed at this time.     Final diagnoses:  Alcoholic ketoacidosis    ED  Discharge Orders     None          Freddi Hamilton, MD 07/14/24 2225
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
## 2024-07-14 NOTE — ED Triage Notes (Signed)
 Pt arrived to registration desk via wheelchair with a bucket, where she placed herself on the floor and began violently rolling around and gagging.   Pt breathing heavily and states I probably have alcohol poisoning. PT states that her arms are locked. Pt reports nausea and vomiting that began at approx 15:00 today. Pt reports that she has been drinking alcohol heavily over the past two days.

## 2024-07-15 ENCOUNTER — Encounter (HOSPITAL_COMMUNITY): Payer: Self-pay | Admitting: Student

## 2024-07-15 DIAGNOSIS — R748 Abnormal levels of other serum enzymes: Secondary | ICD-10-CM | POA: Insufficient documentation

## 2024-07-15 DIAGNOSIS — Z720 Tobacco use: Secondary | ICD-10-CM | POA: Insufficient documentation

## 2024-07-15 DIAGNOSIS — F1990 Other psychoactive substance use, unspecified, uncomplicated: Secondary | ICD-10-CM

## 2024-07-15 DIAGNOSIS — R112 Nausea with vomiting, unspecified: Secondary | ICD-10-CM | POA: Diagnosis not present

## 2024-07-15 DIAGNOSIS — F419 Anxiety disorder, unspecified: Secondary | ICD-10-CM | POA: Insufficient documentation

## 2024-07-15 DIAGNOSIS — R9431 Abnormal electrocardiogram [ECG] [EKG]: Secondary | ICD-10-CM

## 2024-07-15 LAB — HIV ANTIBODY (ROUTINE TESTING W REFLEX): HIV Screen 4th Generation wRfx: NONREACTIVE

## 2024-07-15 LAB — CBC
HCT: 33.5 % — ABNORMAL LOW (ref 36.0–46.0)
Hemoglobin: 10.9 g/dL — ABNORMAL LOW (ref 12.0–15.0)
MCH: 27 pg (ref 26.0–34.0)
MCHC: 32.5 g/dL (ref 30.0–36.0)
MCV: 82.9 fL (ref 80.0–100.0)
Platelets: 209 K/uL (ref 150–400)
RBC: 4.04 MIL/uL (ref 3.87–5.11)
RDW: 14.1 % (ref 11.5–15.5)
WBC: 7.9 K/uL (ref 4.0–10.5)
nRBC: 0 % (ref 0.0–0.2)

## 2024-07-15 LAB — COMPREHENSIVE METABOLIC PANEL WITH GFR
ALT: 50 U/L — ABNORMAL HIGH (ref 0–44)
AST: 123 U/L — ABNORMAL HIGH (ref 15–41)
Albumin: 3.8 g/dL (ref 3.5–5.0)
Alkaline Phosphatase: 75 U/L (ref 38–126)
Anion gap: 18 — ABNORMAL HIGH (ref 5–15)
BUN: 10 mg/dL (ref 6–20)
CO2: 24 mmol/L (ref 22–32)
Calcium: 8.8 mg/dL — ABNORMAL LOW (ref 8.9–10.3)
Chloride: 99 mmol/L (ref 98–111)
Creatinine, Ser: 0.66 mg/dL (ref 0.44–1.00)
GFR, Estimated: >60 mL/min (ref >60)
Glucose, Bld: 64 mg/dL — ABNORMAL LOW (ref 70–99)
Potassium: 3.8 mmol/L (ref 3.5–5.1)
Sodium: 141 mmol/L (ref 135–145)
Total Bilirubin: 1 mg/dL (ref 0.0–1.2)
Total Protein: 6.4 g/dL — ABNORMAL LOW (ref 6.5–8.1)

## 2024-07-15 LAB — MAGNESIUM: Magnesium: 1.9 mg/dL (ref 1.7–2.4)

## 2024-07-15 LAB — GLUCOSE, CAPILLARY: Glucose-Capillary: 79 mg/dL (ref 70–99)

## 2024-07-15 LAB — PHOSPHORUS: Phosphorus: 3.8 mg/dL (ref 2.5–4.6)

## 2024-07-15 MED ORDER — SERTRALINE HCL 50 MG PO TABS
50.0000 mg | ORAL_TABLET | Freq: Every day | ORAL | Status: DC
  Administered 2024-07-15 – 2024-07-16 (×2): 50 mg via ORAL
  Filled 2024-07-15 (×2): qty 1

## 2024-07-15 MED ORDER — DEXTROSE 50 % IV SOLN
0.5000 | Freq: Once | INTRAVENOUS | Status: AC
  Administered 2024-07-15: 25 mL via INTRAVENOUS
  Filled 2024-07-15: qty 50
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
## 2024-07-16 ENCOUNTER — Other Ambulatory Visit (HOSPITAL_COMMUNITY): Payer: Self-pay

## 2024-07-16 DIAGNOSIS — R112 Nausea with vomiting, unspecified: Secondary | ICD-10-CM | POA: Diagnosis not present

## 2024-07-16 LAB — COMPREHENSIVE METABOLIC PANEL WITH GFR
ALT: 58 U/L — ABNORMAL HIGH (ref 0–44)
AST: 148 U/L — ABNORMAL HIGH (ref 15–41)
Albumin: 3.6 g/dL (ref 3.5–5.0)
Alkaline Phosphatase: 69 U/L (ref 38–126)
Anion gap: 10 (ref 5–15)
BUN: 10 mg/dL (ref 6–20)
CO2: 26 mmol/L (ref 22–32)
Calcium: 8.9 mg/dL (ref 8.9–10.3)
Chloride: 102 mmol/L (ref 98–111)
Creatinine, Ser: 0.54 mg/dL (ref 0.44–1.00)
GFR, Estimated: >60 mL/min (ref >60)
Glucose, Bld: 97 mg/dL (ref 70–99)
Potassium: 3.7 mmol/L (ref 3.5–5.1)
Sodium: 137 mmol/L (ref 135–145)
Total Bilirubin: 0.8 mg/dL (ref 0.0–1.2)
Total Protein: 5.9 g/dL — ABNORMAL LOW (ref 6.5–8.1)

## 2024-07-16 LAB — URINE DRUG SCREEN
Amphetamines: NEGATIVE
Barbiturates: NEGATIVE
Benzodiazepines: POSITIVE — AB
Cocaine: NEGATIVE
Fentanyl: NEGATIVE
Methadone Scn, Ur: NEGATIVE
Opiates: NEGATIVE
Tetrahydrocannabinol: POSITIVE — AB

## 2024-07-16 MED ORDER — SERTRALINE HCL 50 MG PO TABS
50.0000 mg | ORAL_TABLET | Freq: Every day | ORAL | 0 refills | Status: AC
  Filled 2024-07-16: qty 90, 90d supply, fill #0

## 2024-07-16 MED ORDER — FOLIC ACID 1 MG PO TABS
1.0000 mg | ORAL_TABLET | Freq: Every day | ORAL | 0 refills | Status: AC
  Filled 2024-07-16: qty 90, 90d supply, fill #0

## 2024-07-16 MED ORDER — VITAMIN B-1 100 MG PO TABS
100.0000 mg | ORAL_TABLET | Freq: Every day | ORAL | 0 refills | Status: AC
  Filled 2024-07-16: qty 90, 90d supply, fill #0

## 2024-07-16 NOTE — TOC Transition Note (Signed)
 Transition of Care [facility]) - Discharge Note  Patient Details  Name: Loretta Brewer MRN: [id number] Date of Birth: 10/22/1985  Transition of [facility]) CM/SW Contact:  Duwaine GORMAN Aran, LCSW Phone Number: 07/16/2024, 11:12 AM  Clinical Narrative: Care management consulted for ETOH use resources. Chart review indicated several SDOH needs. CSW met with patient to discuss consult and SDOH screening. Patient agreeable to resources being added to AVS. CSW added resources to AVS. Care management signing off.  Final next level of care: Home/Self Care Barriers to Discharge: Barriers Resolved  Patient Goals and CMS Choice Patient states their goals for this hospitalization and ongoing recovery are:: Get community resources Choice offered to / list presented to : NA  Discharge Plan and Services Additional resources added to the After Visit Summary for          DME Arranged: N/A DME Agency: NA  Social Drivers of Health (SDOH) Interventions SDOH Screenings   Food Insecurity: Food Insecurity Present (07/14/2024)  Housing: High Risk (07/14/2024)  Transportation Needs: Unmet Transportation Needs (07/14/2024)  Utilities: Not At Risk (07/14/2024)  Depression (PHQ2-9): Low Risk  (10/21/2022)  Recent Concern: Depression (PHQ2-9) - Medium Risk (10/13/2022)  Financial Resource Strain: High Risk (06/29/2018)  Physical Activity: Inactive (06/29/2018)  Social Connections: Unknown (12/27/2021)   Received from [facility]  Stress: Stress Concern Present (06/29/2018)  Tobacco Use: High Risk (07/15/2024)   Readmission Risk Interventions     No data to display

## 2024-07-16 NOTE — Plan of Care (Signed)

## 2024-07-16 NOTE — Progress Notes (Signed)
 Discharge medications delivered to patient at the bedside.
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
